# Patient Record
Sex: Female | Born: 1960 | Race: Asian | Hispanic: No | Marital: Married | Smoking: Former smoker
Health system: Southern US, Community
[De-identification: ages and names within clinical notes are randomized; demographics above are authoritative.]

## PROBLEM LIST (undated history)

## (undated) DIAGNOSIS — D131 Benign neoplasm of stomach: Secondary | ICD-10-CM

## (undated) DIAGNOSIS — E039 Hypothyroidism, unspecified: Secondary | ICD-10-CM

## (undated) DIAGNOSIS — K219 Gastro-esophageal reflux disease without esophagitis: Secondary | ICD-10-CM

## (undated) DIAGNOSIS — K449 Diaphragmatic hernia without obstruction or gangrene: Secondary | ICD-10-CM

## (undated) DIAGNOSIS — F419 Anxiety disorder, unspecified: Secondary | ICD-10-CM

## (undated) DIAGNOSIS — D649 Anemia, unspecified: Secondary | ICD-10-CM

## (undated) DIAGNOSIS — N200 Calculus of kidney: Secondary | ICD-10-CM

## (undated) DIAGNOSIS — K760 Fatty (change of) liver, not elsewhere classified: Secondary | ICD-10-CM

## (undated) DIAGNOSIS — L409 Psoriasis, unspecified: Secondary | ICD-10-CM

## (undated) DIAGNOSIS — E119 Type 2 diabetes mellitus without complications: Secondary | ICD-10-CM

## (undated) DIAGNOSIS — E785 Hyperlipidemia, unspecified: Secondary | ICD-10-CM

## (undated) DIAGNOSIS — K222 Esophageal obstruction: Secondary | ICD-10-CM

## (undated) DIAGNOSIS — J45909 Unspecified asthma, uncomplicated: Secondary | ICD-10-CM

## (undated) DIAGNOSIS — F410 Panic disorder [episodic paroxysmal anxiety] without agoraphobia: Secondary | ICD-10-CM

## (undated) DIAGNOSIS — M1990 Unspecified osteoarthritis, unspecified site: Secondary | ICD-10-CM

## (undated) DIAGNOSIS — T7840XA Allergy, unspecified, initial encounter: Secondary | ICD-10-CM

## (undated) DIAGNOSIS — F32A Depression, unspecified: Secondary | ICD-10-CM

## (undated) DIAGNOSIS — G4730 Sleep apnea, unspecified: Secondary | ICD-10-CM

## (undated) HISTORY — DX: Type 2 diabetes mellitus without complications: E11.9

## (undated) HISTORY — DX: Diaphragmatic hernia without obstruction or gangrene: K44.9

## (undated) HISTORY — DX: Sleep apnea, unspecified: G47.30

## (undated) HISTORY — DX: Allergy, unspecified, initial encounter: T78.40XA

## (undated) HISTORY — PX: DILATION AND CURETTAGE OF UTERUS: SHX78

## (undated) HISTORY — PX: CHOLECYSTECTOMY: SHX55

## (undated) HISTORY — DX: Anemia, unspecified: D64.9

## (undated) HISTORY — DX: Anxiety disorder, unspecified: F41.9

## (undated) HISTORY — DX: Depression, unspecified: F32.A

## (undated) HISTORY — DX: Unspecified osteoarthritis, unspecified site: M19.90

## (undated) HISTORY — DX: Hypothyroidism, unspecified: E03.9

## (undated) HISTORY — DX: Benign neoplasm of stomach: D13.1

## (undated) HISTORY — DX: Psoriasis, unspecified: L40.9

## (undated) HISTORY — DX: Unspecified asthma, uncomplicated: J45.909

## (undated) HISTORY — DX: Esophageal obstruction: K22.2

## (undated) HISTORY — DX: Panic disorder (episodic paroxysmal anxiety): F41.0

## (undated) HISTORY — DX: Hyperlipidemia, unspecified: E78.5

## (undated) HISTORY — DX: Fatty (change of) liver, not elsewhere classified: K76.0

## (undated) HISTORY — PX: TONSILLECTOMY: SUR1361

## (undated) HISTORY — DX: Gastro-esophageal reflux disease without esophagitis: K21.9

## (undated) HISTORY — PX: OTHER SURGICAL HISTORY: SHX169

---

## 2001-04-19 ENCOUNTER — Encounter: Admission: RE | Admit: 2001-04-19 | Discharge: 2001-04-19 | Payer: Self-pay | Admitting: Internal Medicine

## 2001-04-19 ENCOUNTER — Encounter: Payer: Self-pay | Admitting: Internal Medicine

## 2002-08-10 ENCOUNTER — Other Ambulatory Visit: Admission: RE | Admit: 2002-08-10 | Discharge: 2002-08-10 | Payer: Self-pay | Admitting: Obstetrics and Gynecology

## 2003-07-24 ENCOUNTER — Other Ambulatory Visit: Admission: RE | Admit: 2003-07-24 | Discharge: 2003-07-24 | Payer: Self-pay | Admitting: Family Medicine

## 2004-01-31 ENCOUNTER — Encounter (INDEPENDENT_AMBULATORY_CARE_PROVIDER_SITE_OTHER): Payer: Self-pay | Admitting: *Deleted

## 2004-01-31 ENCOUNTER — Ambulatory Visit (HOSPITAL_COMMUNITY): Admission: RE | Admit: 2004-01-31 | Discharge: 2004-01-31 | Payer: Self-pay | Admitting: Internal Medicine

## 2004-02-21 ENCOUNTER — Ambulatory Visit (HOSPITAL_COMMUNITY): Admission: RE | Admit: 2004-02-21 | Discharge: 2004-02-21 | Payer: Self-pay | Admitting: Internal Medicine

## 2004-02-21 ENCOUNTER — Encounter: Payer: Self-pay | Admitting: Internal Medicine

## 2004-06-04 ENCOUNTER — Ambulatory Visit: Payer: Self-pay | Admitting: Internal Medicine

## 2004-12-09 ENCOUNTER — Ambulatory Visit: Payer: Self-pay | Admitting: Internal Medicine

## 2004-12-13 ENCOUNTER — Ambulatory Visit: Payer: Self-pay | Admitting: Internal Medicine

## 2004-12-13 ENCOUNTER — Encounter (INDEPENDENT_AMBULATORY_CARE_PROVIDER_SITE_OTHER): Payer: Self-pay | Admitting: Specialist

## 2005-01-13 ENCOUNTER — Other Ambulatory Visit: Admission: RE | Admit: 2005-01-13 | Discharge: 2005-01-13 | Payer: Self-pay | Admitting: Obstetrics and Gynecology

## 2007-07-06 ENCOUNTER — Ambulatory Visit: Payer: Self-pay | Admitting: Internal Medicine

## 2007-07-06 LAB — CONVERTED CEMR LAB: Vitamin B-12: 521 pg/mL (ref 211–911)

## 2007-07-20 ENCOUNTER — Encounter: Payer: Self-pay | Admitting: Internal Medicine

## 2007-07-20 ENCOUNTER — Ambulatory Visit: Payer: Self-pay | Admitting: Internal Medicine

## 2009-06-09 HISTORY — PX: COLONOSCOPY: SHX174

## 2009-06-26 ENCOUNTER — Encounter: Admission: RE | Admit: 2009-06-26 | Discharge: 2009-06-26 | Payer: Self-pay | Admitting: Internal Medicine

## 2009-07-11 ENCOUNTER — Encounter: Admission: RE | Admit: 2009-07-11 | Discharge: 2009-07-11 | Payer: Self-pay | Admitting: Internal Medicine

## 2009-08-27 DIAGNOSIS — K449 Diaphragmatic hernia without obstruction or gangrene: Secondary | ICD-10-CM | POA: Insufficient documentation

## 2009-08-27 DIAGNOSIS — F3289 Other specified depressive episodes: Secondary | ICD-10-CM | POA: Insufficient documentation

## 2009-08-27 DIAGNOSIS — F411 Generalized anxiety disorder: Secondary | ICD-10-CM | POA: Insufficient documentation

## 2009-08-27 DIAGNOSIS — F329 Major depressive disorder, single episode, unspecified: Secondary | ICD-10-CM | POA: Insufficient documentation

## 2009-08-27 DIAGNOSIS — F410 Panic disorder [episodic paroxysmal anxiety] without agoraphobia: Secondary | ICD-10-CM | POA: Insufficient documentation

## 2009-08-27 DIAGNOSIS — K219 Gastro-esophageal reflux disease without esophagitis: Secondary | ICD-10-CM | POA: Insufficient documentation

## 2009-08-27 DIAGNOSIS — K222 Esophageal obstruction: Secondary | ICD-10-CM | POA: Insufficient documentation

## 2009-08-27 DIAGNOSIS — G4730 Sleep apnea, unspecified: Secondary | ICD-10-CM | POA: Insufficient documentation

## 2009-08-31 ENCOUNTER — Ambulatory Visit: Payer: Self-pay | Admitting: Internal Medicine

## 2009-09-25 ENCOUNTER — Encounter (INDEPENDENT_AMBULATORY_CARE_PROVIDER_SITE_OTHER): Payer: Self-pay

## 2009-09-25 ENCOUNTER — Ambulatory Visit: Payer: Self-pay | Admitting: Internal Medicine

## 2009-10-02 ENCOUNTER — Encounter: Admission: RE | Admit: 2009-10-02 | Discharge: 2009-11-06 | Payer: Self-pay | Admitting: Internal Medicine

## 2009-11-23 ENCOUNTER — Ambulatory Visit: Payer: Self-pay | Admitting: Internal Medicine

## 2009-11-26 ENCOUNTER — Encounter: Payer: Self-pay | Admitting: Internal Medicine

## 2010-06-14 ENCOUNTER — Encounter: Admission: RE | Admit: 2010-06-14 | Payer: Self-pay | Source: Home / Self Care | Admitting: Unknown Physician Specialty

## 2010-06-19 ENCOUNTER — Encounter: Admission: RE | Admit: 2010-06-19 | Payer: Self-pay | Source: Home / Self Care | Admitting: Unknown Physician Specialty

## 2010-07-03 ENCOUNTER — Encounter
Admission: RE | Admit: 2010-07-03 | Discharge: 2010-07-03 | Payer: Self-pay | Source: Home / Self Care | Attending: Internal Medicine | Admitting: Internal Medicine

## 2010-07-09 NOTE — Letter (Signed)
Summary: [facility] Instructions  [facility]  [address] [place], [place] [address]   Phone: [phone]  Fax: [phone]       Wendy Curtis    [birth year]    MRN: [id number]       Procedure Day Dorna Bloom: Farrell Ours  11/23/09     Arrival Time:  8:30am     Procedure Time:  9:30a     Location of Procedure:                    _ X_  [facility] (4th Floor)    PREPARATION FOR COLONOSCOPY WITH MIRALAX  Starting 5 days prior to your procedure  Sunday 06/12  do not eat nuts, seeds, popcorn, corn, beans, peas,  salads, or any raw vegetables.  Do not take any fiber supplements (e.g. Metamucil, Citrucel, and Benefiber). ____________________________________________________________________________________________________   THE DAY BEFORE YOUR PROCEDURE         DATE:  06/16 DAY: THURSDAY  1   Drink clear liquids the entire day-NO SOLID FOOD  2   Do not drink anything colored red or purple.  Avoid juices with pulp.  No orange juice.  3   Drink at least 64 oz. (8 glasses) of fluid/clear liquids during the day to prevent dehydration and help the prep work efficiently.  CLEAR LIQUIDS INCLUDE: Water Jello Ice Popsicles Tea (sugar ok, no milk/cream) Powdered fruit flavored drinks Coffee (sugar ok, no milk/cream) Gatorade Juice: apple, white grape, white cranberry  Lemonade Clear bullion, consomm, broth Carbonated beverages (any kind) Strained chicken noodle soup Hard Candy  4   Mix the entire bottle of Miralax with 64 oz. of Gatorade/Powerade in the morning and put in the refrigerator to chill.  5   At 3:00 pm take 2 Dulcolax/Bisacodyl tablets.  6   At 4:30 pm take one Reglan/Metoclopramide tablet.  7  Starting at 5:00 pm drink one 8 oz glass of the Miralax mixture every 15-20 minutes until you have finished drinking the entire 64 oz.  You should finish drinking prep around 7:30 or 8:00 pm.  8   If you are nauseated, you may take the 2nd  Reglan/Metoclopramide tablet at 6:30 pm.        9    At 8:00 pm take 2 more DULCOLAX/Bisacodyl tablets.     THE DAY OF YOUR PROCEDURE      DATE:  06/17  DAY: Farrell Ours  You may drink clear liquids until 7:30am (2 HOURS BEFORE PROCEDURE).   MEDICATION INSTRUCTIONS  Unless otherwise instructed, you should take regular prescription medications with a small sip of water as early as possible the morning of your procedure.         OTHER INSTRUCTIONS  You will need a responsible adult at least 52 years of age to accompany you and drive you home.   This person must remain in the waiting room during your procedure.  Wear loose fitting clothing that is easily removed.  Leave jewelry and other valuables at home.  However, you may wish to bring a book to read or an iPod/MP3 player to listen to music as you wait for your procedure to start.  Remove all body piercing jewelry and leave at home.  Total time from sign-in until discharge is approximately 2-3 hours.  You should go home directly after your procedure and rest.  You can resume normal activities the day after your procedure.  The day of your procedure you should not:  Drive   Make legal decisions   Operate machinery   Drink alcohol   Return to work  You will receive specific instructions about eating, activities and medications before you leave.   The above instructions have been reviewed and explained to me by   Ulis Rias RN  September 25, 2009 4:52 PM     I fully understand and can verbalize these instructions _____________________________ Date _______

## 2010-07-09 NOTE — Procedures (Signed)
Summary: EGD   EGD  Procedure date:  07/20/2007  Findings:      Location: [facility]   Patient Name: Wendy Curtis, Wendy Curtis MRN:  Procedure Procedures: Panendoscopy (EGD) CPT: 43235.    with biopsy(s)/brushing(s). CPT: D1846139.  Personnel: Endoscopist: Dora L. Juanda Chance, MD.  Exam Location: Exam performed in Outpatient Clinic. Outpatient  Patient Consent: Procedure, Alternatives, Risks and Benefits discussed, consent obtained, from patient. Consent was obtained by the RN.  Indications Symptoms: Reflux symptoms for 1-5 yrs,  Surveillance of: Last exam: Jul, 2006.  History  Current Medications: Patient is not currently taking Coumadin.  Pre-Exam Physical: Performed Jul 20, 2007  Entire physical exam was normal.  Comments: Pt. history reviewed/updated, physical exam performed prior to initiation of sedation?yes Exam Exam Info: Maximum depth of insertion Duodenum, intended Duodenum. Vocal cords visualized. Gastric retroflexion performed. Images taken. ASA Classification: I. Tolerance: good.  Sedation Meds: Patient assessed and found to be appropriate for moderate (conscious) sedation. Fentanyl 75 mcg. given IV. Versed 7 mg. given IV. Cetacaine Spray 2 sprays given aerosolized.  Monitoring: BP and pulse monitoring done. Oximetry used. Supplemental O2 given  Findings - STRICTURE / STENOSIS: Stricture in Mid Esophagus.  Constriction: partial. Etiology: benign due to reflux. 30 cm from mouth. ICD9: Esophageal Stricture: 530.3. Comment: nonobstructing esophageal stricture.  - HIATAL HERNIA: Diaphragm 35 cm from mouth. Z-line/GE Junction 30 cm from mouth. Regular, 5 cms. in length. large nonreducible HH. Biopsy/Hiatal Hernia taken.  ICD9: Hernia, Hiatal: 553.3. - Normal: Duodenal Bulb to Duodenal 2nd Portion.  Normal: Antrum.   Assessment Abnormal examination, see findings above.  Diagnoses: 553.3: Hernia, Hiatal.  530.3: Esophageal Stricture.    Comments: large HH unchanged from the prior exam Events  Unplanned Intervention: No unplanned interventions were required.  Unplanned Events: There were no complications. Plans Medication(s): Await pathology. PPI: Omeprazole/Prilosec 20 mg QD, starting Jul 20, 2007   Patient Education: Patient given standard instructions for: Reflux.  Comments: may increase the Prelosec to bid if symptoms flare up Disposition: After procedure patient sent to recovery. After recovery patient sent home.   This report was created from the original endoscopy report, which was reviewed and signed by the above listed endoscopist.

## 2010-07-09 NOTE — Procedures (Signed)
Summary: Upper Endoscopy  Patient: Wendy Curtis Note: All result statuses are Final unless otherwise noted.  Tests: (1) Upper Endoscopy (EGD)   EGD Upper Endoscopy       DONE     [facility]     [address]     [place], [place]  [address]           ENDOSCOPY PROCEDURE REPORT           PATIENT:  Wendy, Curtis  MR#:  [id number]     BIRTHDATE:  03/27/1961, 49 yrs. old  GENDER:  female           ENDOSCOPIST:  Hedwig Morton. Juanda Chance, MD     Referred by:  Ellamae Sia, M.D.           PROCEDURE DATE:  11/23/2009     PROCEDURE:  EGD with biopsy     ASA CLASS:  Class II     INDICATIONS:  GERD, heartburn hx of large hiatal hernia,     F hx of gastric cancer in a brother     last EGD 07/2007     consider Nissen Fundoplication           MEDICATIONS:   Versed 5 mg, Fentanyl 50 mcg     TOPICAL ANESTHETIC:  Exactacain Spray           DESCRIPTION OF PROCEDURE:   After the risks benefits and     alternatives of the procedure were thoroughly explained, informed     consent was obtained.  The [facility] [id number] endoscope was     introduced through the mouth and advanced to the second portion of     the duodenum, without limitations.  The instrument was slowly     withdrawn as the mucosa was fully examined.     <<PROCEDUREIMAGES>>           A stricture was found (see image1). nonobstructing fibrous ring at     g-e junction  A hiatal hernia was found. 5 cm nonreducible hiatal     hernia, 30-35 cm, no Cameron erosions With standard forceps, a     biopsy was obtained and sent to pathology (see image1 and image4).     r/o Barrett's  There were multiple polyps identified. fundic gland     polyps With standard forceps, a biopsy was obtained and sent to     pathology.  Otherwise the examination was normal (see image2,     image3, and image5).    Retroflexed views revealed no     abnormalities.    The scope was then withdrawn from the patient     and the procedure completed.          COMPLICATIONS:  None           ENDOSCOPIC IMPRESSION:     1) Stricture     2) Hiatal hernia     3) Polyps, multiple     4) Otherwise normal examination     RECOMMENDATIONS:     1) Anti-reflux regimen to be follow     2) Await biopsy results     continue Prelosec 20 mg po bid     weight loss,     Zegrid for breakthrough symptoms           REPEAT EXAM:  In 0 year(s) for.           ______________________________     Hedwig Morton. Juanda Chance, MD  CC:           n.     eSIGNED:   Kaylee Wombles M. Deatrice Spanbauer at 11/23/2009 10:17 AM           Charlyn Minerva, [phone]  Note: An exclamation mark (!) indicates a result that was not dispersed into the flowsheet. Document Creation Date: 11/23/2009 10:18 AM _______________________________________________________________________  (1) Order result status: Final Collection or observation date-time: 11/23/2009 09:45 Requested date-time:  Receipt date-time:  Reported date-time:  Referring Physician:   Ordering Physician: Lina Sar [phone]) Specimen Source:  Source: Launa Grill Order Number: [id number] Lab site:

## 2010-07-09 NOTE — Procedures (Signed)
Summary: Colonoscopy  Patient: Marinda Capasso Note: All result statuses are Final unless otherwise noted.  Tests: (1) Colonoscopy (COL)   COL Colonoscopy           [facility]     [address]     [place], [place]  [address]           COLONOSCOPY PROCEDURE REPORT           PATIENT:  Harmony, Sandell  MR#:  [id number]     BIRTHDATE:  [birth year], 49 yrs. old  GENDER:  female     ENDOSCOPIST:  Hedwig Morton. Juanda Chance, MD     REF. BY:  Ellamae Sia, M.D.     PROCEDURE DATE:  11/23/2009     PROCEDURE:  Colonoscopy 13086     ASA CLASS:  Class II     INDICATIONS:  Routine Risk Screening     MEDICATIONS:   Versed 5 mg, Fentanyl 25 mcg           DESCRIPTION OF PROCEDURE:   After the risks benefits and     alternatives of the procedure were thoroughly explained, informed     consent was obtained.  Digital rectal exam was performed and     revealed no rectal masses.   The LB [id number] endoscope     was introduced through the anus and advanced to the cecum, which     was identified by both the appendix and ileocecal valve, without     limitations.  The quality of the prep was excellent, using     MiraLax.  The instrument was then slowly withdrawn as the colon     was fully examined.     <<PROCEDUREIMAGES>>           FINDINGS:  No polyps or cancers were seen (see image1, image2,     image3, image4, image5, and image6).   Retroflexed views in the     rectum revealed no abnormalities.    The scope was then withdrawn     from the patient and the procedure completed.           COMPLICATIONS:  None     ENDOSCOPIC IMPRESSION:     1) No polyps or cancers     2) Normal colonoscopy     RECOMMENDATIONS:     1) high fiber diet     REPEAT EXAM:  In 10 year(s) for.           ______________________________     Hedwig Morton. Juanda Chance, MD           CC:           n.     REVISED:  11/23/2009 10:25 AM     eSIGNED:   Hedwig Morton. Brodie at 11/23/2009 10:25 AM           Charlyn Minerva, [id number]  Note: An exclamation mark (!) indicates a result that was not dispersed into the flowsheet. Document Creation Date: 11/23/2009 10:26 AM _______________________________________________________________________  (1) Order result status: Final Collection or observation date-time: 11/23/2009 10:06 Requested date-time:  Receipt date-time:  Reported date-time:  Referring Physician:   Ordering Physician: Lina Sar [phone]) Specimen Source:  Source: Launa Grill Order Number: [id number] Lab site:   Appended Document: Colonoscopy    Clinical Lists Changes  Observations: Added new observation of COLONNXTDUE: 11/2019 (11/23/2009 14:38)      Appended Document: Colonoscopy 10 yr recall  Procedures Next Due Date:    Colonoscopy: 11/2019

## 2010-07-09 NOTE — Assessment & Plan Note (Signed)
Summary: 6 month f.u consult before ecl..em    History of Present Illness Visit Type: Follow-up Visit Primary GI MD: Lina Sar MD Primary Provider: Ellamae Sia, MD  Requesting Provider: n/a Chief Complaint: Consult for colon and egd. Pt states that she is not having any problems and denies any GI complaints  History of Present Illness:   This is a 50 year old African American female with severe gastroesophageal reflux and a large hiatal hernia documented on several prior upper endoscopies. Her last one was in February 2009. She was at one point evaluated for an upper Nissen fundoplication but decided against it. There is no evidence of Barrett's esophagus but she remains quite anxious about the possibility because her brother had stomach cancer. She comes for followup of gastroesophageal reflux and also to discuss having a colonoscopy. She is currently on Prilosec 20 mg p.o. b.i.d. and Zegrid one at night.   GI Review of Systems      Denies abdominal pain, acid reflux, belching, bloating, chest pain, dysphagia with liquids, dysphagia with solids, heartburn, loss of appetite, nausea, vomiting, vomiting blood, weight loss, and  weight gain.        Denies anal fissure, black tarry stools, change in bowel habit, constipation, diarrhea, diverticulosis, fecal incontinence, heme positive stool, hemorrhoids, irritable bowel syndrome, jaundice, light color stool, liver problems, rectal bleeding, and  rectal pain.    Current Medications (verified): 1)  Prilosec Otc 20 Mg Tbec (Omeprazole Magnesium) .... One Tablet By Mouth Two Times A Day 2)  Aspirin 81 Mg Chew (Aspirin) .... One Tablet By Mouth Once Daily 3)  Vitamin D3 2000 Unit Caps (Cholecalciferol) .... One Tablet By Mouth Once Daily 4)  Zyrtec Allergy 10 Mg Caps (Cetirizine Hcl) .... One Capsule By Mouth Once Daily 5)  Clarinex 5 Mg Tabs (Desloratadine) .... One Tablet By Mouth Once Daily 6)  Flonase 50 Mcg/act Susp (Fluticasone  Propionate) .... As Needed 7)  Zantac 75 75 Mg Tabs (Ranitidine Hcl) .... One Tablet By Mouth Once Daily  Allergies (verified): 1)  ! Pcn 2)  ! * Novacaine  Past History:  Past Medical History: Reviewed history from 08/27/2009 and no changes required. Current Problems:  Hx of ESOPHAGEAL STRICTURE (ICD-530.3) ANXIETY (ICD-300.00) PANIC DISORDER (ICD-300.01) DEPRESSION (ICD-311) SLEEP APNEA (ICD-780.57) HIATAL HERNIA (ICD-553.3) GERD (ICD-530.81)    Past Surgical History: Reviewed history from 08/27/2009 and no changes required. Tonsillectomy & Adenoidectomy D & C Cholecystectomy  Family History: Reviewed history from 08/27/2009 and no changes required. No FH of Colon Cancer: Family History of Diabetes:   Social History: Reviewed history from 08/27/2009 and no changes required. Occupation: Runner, broadcasting/film/video Alcohol Use - yes-occasional Illicit Drug Use - no  Review of Systems  The patient denies allergy/sinus, anemia, anxiety-new, arthritis/joint pain, back pain, blood in urine, breast changes/lumps, change in vision, confusion, cough, coughing up blood, depression-new, fainting, fatigue, fever, headaches-new, hearing problems, heart murmur, heart rhythm changes, itching, menstrual pain, muscle pains/cramps, night sweats, nosebleeds, pregnancy symptoms, shortness of breath, skin rash, sleeping problems, sore throat, swelling of feet/legs, swollen lymph glands, thirst - excessive , urination - excessive , urination changes/pain, urine leakage, vision changes, and voice change.         Pertinent positive and negative review of systems were noted in the above HPI. All other ROS was otherwise negative.   Vital Signs:  Patient profile:   50 year old female Height:      58 inches Weight:      206 pounds BMI:  43.21 BSA:     1.85 Pulse rate:   72 / minute Pulse rhythm:   regular BP sitting:   128 / 82  (left arm) Cuff size:   regular  Vitals Entered By: Ok Anis CMA (August 31, 2009 3:53 PM)   Impression & Recommendations:  Problem # 1:  HIATAL HERNIA (ICD-553.3) There is a 5-6 cm nonreducible hiatal hernia causing chronic gastroesophageal reflux. It seems to be under control with Prilosec 20 mg twice a day and Zegrid, one at bedtime. She will be scheduled for a followup upper endoscopy.  Problem # 2:  SCREENING, COLON CANCER (ICD-V76.51) There is no family history of colon cancer. She has normal bowel habits. We wiill schedule her for a screening colonoscopy.  Patient Instructions: 1)  upper endoscopy with possible dilation and colonoscopy. scheduled for May 2011. 2)  Antireflux measures. 3)  Prilosec 20 mg p.o. b.i.d. 4)  Zegrid 20 mg p.o. for breakthrough symptoms. 5)  Patient wishes to call back to set up endo/colon after she looks at her schedule. 6)  Copy sent to : Dr Merla Riches

## 2010-07-09 NOTE — Letter (Signed)
Summary: Patient [facility] Biopsy Results  [facility]  [address] [place], [place] [address]   Phone: [phone]  Fax: [phone]        November 26, 2009 MRN: [id number]    Wendy Curtis [address] [place], [place]  [address]    Dear Ms. [name],  I am pleased to inform you that the biopsies taken during your recent endoscopic examination did not show any evidence of cancer upon pathologic examination.The biopsies show inflammation due to reflux  Additional information/recommendations:  __No further action is needed at this time.  Please follow-up with      your primary care physician for your other healthcare needs.  __ Please call [phone] to schedule a return visit to review      your condition.  _x_ Continue with the treatment plan as outlined on the day of your      exam.     Please call us if you are having persistent problems or have questions about your condition that have not been fully answered at this time.  Sincerely,  Hart Carwin MD  This letter has been electronically signed by your physician.  Appended Document: Patient Notice-Endo Biopsy Results letter mailed.

## 2010-07-09 NOTE — Miscellaneous (Signed)
Summary: Lec previsit  Clinical Lists Changes  Medications: Added new medication of MIRALAX   POWD (POLYETHYLENE GLYCOL 3350) As per prep  instructions. - Signed Added new medication of REGLAN 10 MG  TABS (METOCLOPRAMIDE HCL) As per prep instructions. - Signed Added new medication of DULCOLAX 5 MG  TBEC (BISACODYL) Day before procedure take 2 at 3pm and 2 at 8pm. - Signed Rx of MIRALAX   POWD (POLYETHYLENE GLYCOL 3350) As per prep  instructions.;  #255gm x 0;  Signed;  Entered by: Ulis Rias RN;  Authorized by: Hart Carwin MD;  Method used: Electronically to [facility]  [phone]*, [address] [place], [place]  [address], Ph: [phone] or [phone], Fax: [phone] Rx of REGLAN 10 MG  TABS (METOCLOPRAMIDE HCL) As per prep instructions.;  #2 x 0;  Signed;  Entered by: Ulis Rias RN;  Authorized by: Hart Carwin MD;  Method used: Electronically to [facility]  [phone]*, [address], [place], [place]  [address], Ph: [phone] or [phone], Fax: [phone] Rx of DULCOLAX 5 MG  TBEC (BISACODYL) Day before procedure take 2 at 3pm and 2 at 8pm.;  #4 x 0;  Signed;  Entered by: Ulis Rias RN;  Authorized by: Hart Carwin MD;  Method used: Electronically to [facility]  [phone]*, [address], [place], [place]  [address], Ph: [phone] or [phone], Fax: [phone] Observations: Added new observation of ALLERGY REV: Done (09/25/2009 16:14)    Prescriptions: DULCOLAX 5 MG  TBEC (BISACODYL) Day before procedure take 2 at 3pm and 2 at 8pm.  #4 x 0   Entered by:   Ulis Rias RN   Authorized by:   Hart Carwin MD   Signed by:   Ulis Rias RN on 09/25/2009   Method used:   Electronically to        [facility]  [phone]* (retail)       [address] [place], [place]  [address]       Ph: [phone] or [phone]       Fax: [phone]   RxID:   [id number] REGLAN 10 MG  TABS (METOCLOPRAMIDE HCL) As per prep instructions.  #2 x 0  Entered by:   Ulis Rias RN   Authorized by:   Hart Carwin MD   Signed by:   Ulis Rias RN on 09/25/2009   Method used:   Electronically to        [facility]  [phone]* (retail)       [address] [place], [place]  [address]       Ph: [phone] or [phone]       Fax: [phone]   RxID:   [id number] MIRALAX   POWD (POLYETHYLENE GLYCOL 3350) As per prep  instructions.  #255gm x 0   Entered by:   Ulis Rias RN   Authorized by:   Hart Carwin MD   Signed by:   Ulis Rias RN on 09/25/2009   Method used:   Electronically to        [facility]  [phone]* (retail)       [address] [place], [place]  [address]       Ph: [phone] or [phone]       Fax: [phone]   RxID:   [id number]

## 2010-07-10 ENCOUNTER — Encounter: Payer: Self-pay | Admitting: Unknown Physician Specialty

## 2010-10-22 NOTE — Assessment & Plan Note (Signed)
[facility]                         GASTROENTEROLOGY OFFICE NOTE   JAYCIE, KREGEL                      MRN:          [id number]  DATE:07/06/2007                            DOB:          April 04, 1961    [name] is a 50 year old [place] female. old [place] female.  She is a Runner, broadcasting/film/video at  [employer] who has chronic esophageal reflux disease.  We have evaluated her in the past for her reflux and even scheduled her  for monometry and pH probe, but she decided to continue with medical  treatment and has done well for the past several years.  She is now  again considering possibility of Nissen fundoplication.  She has a large  hiatal hernia shown on upper GI series in 2005 measuring about 6-8 cm.  This also was confirmed on upper endoscopy, first one done in August  2005, the last one in July 2006.  Her diaphragm is at 35 cm, Z line at  30 cm.  It is an nonreducible hernia.  It was very well controlled with  strict antireflux measures and Aciphex 200 mg twice a day until her  insurance suggested using Prilosec.  She has taken Prilosec 20 mg twice  a day since October 2008 and has about 90% relief of her symptoms.  She  on occasion has a scratchiness and burning in the back of her throat at  night.  She, unfortunately, has been unable to lose weight.  In fact,  she has continued to gain weight from 177 pounds in October 2005 to 184  pounds in July 2008, then 194 pounds now.  She comes today to discuss  treatment with surgery.  She also wants to know the long-term effect of  proton pump inhibitors and choice of proton pump inhibitor.   PHYSICAL EXAMINATION:  VITAL SIGNS:  Blood pressure 98/64, pulse 80,  weight 194 pounds.  The patient was not re-examined today.   IMPRESSION:  A 50 year old African-American female female who has a large  hiatal hernia, chronic esophageal reflux reasonably well controlled on  proton pump inhibitor twice-a-day dose.  There  was no evidence of  Barrett's esophagus on most recent endoscopy in July 2006.  The patient  is interested in being reevaluated and assessed for possible Nissen  fundoplication.  She is concerned about possible B12 deficiency, the  long-term use of proton pump inhibitor, the expense of it as well as  possibly increased GI infections.   PLAN:  1. Upper endoscopy scheduled with appropriate biopsies.  We will also      re-examine the hiatal hernia for size and general anatomy of her      stomach.  2. She will certainly need another barium swallow and possibly      manometry before a final decision will be made for Nissen      fundoplication.  3. She would like to speak with Dr. Luretha Murphy concerning the      surgery which probably would be planned for the summer of 2009 when      she is out of school.  4. The patient may  use Tums or Rolaids at bedtime for additional      coating effect and antacid effect.  5. Will check her B12 level today.  6. Continue Prilosec 20 mg twice a day.     Hedwig Morton. Juanda Chance, MD  Electronically Signed    DMB/MedQ  DD: 07/06/2007  DT: 07/07/2007  Job #: [id number]   cc:   Harrel Lemon. Merla Riches, M.D.  [place] Daphine Deutscher, MD

## 2011-02-11 ENCOUNTER — Ambulatory Visit: Payer: Self-pay | Admitting: Internal Medicine

## 2011-02-12 ENCOUNTER — Ambulatory Visit: Payer: Self-pay | Admitting: Internal Medicine

## 2011-05-20 ENCOUNTER — Encounter: Payer: Self-pay | Admitting: *Deleted

## 2011-05-27 ENCOUNTER — Ambulatory Visit: Payer: Self-pay | Admitting: Internal Medicine

## 2011-06-11 ENCOUNTER — Encounter: Payer: Self-pay | Admitting: Internal Medicine

## 2011-06-20 ENCOUNTER — Encounter: Payer: Self-pay | Admitting: *Deleted

## 2011-07-02 ENCOUNTER — Ambulatory Visit: Payer: Self-pay | Admitting: Internal Medicine

## 2011-07-23 ENCOUNTER — Encounter: Payer: Self-pay | Admitting: Internal Medicine

## 2011-09-12 ENCOUNTER — Ambulatory Visit (INDEPENDENT_AMBULATORY_CARE_PROVIDER_SITE_OTHER): Payer: BC Managed Care – PPO | Admitting: Internal Medicine

## 2011-09-12 ENCOUNTER — Ambulatory Visit: Payer: BC Managed Care – PPO

## 2011-09-12 DIAGNOSIS — IMO0001 Reserved for inherently not codable concepts without codable children: Secondary | ICD-10-CM | POA: Insufficient documentation

## 2011-09-12 DIAGNOSIS — J309 Allergic rhinitis, unspecified: Secondary | ICD-10-CM | POA: Insufficient documentation

## 2011-09-12 DIAGNOSIS — Z6841 Body Mass Index (BMI) 40.0 and over, adult: Secondary | ICD-10-CM

## 2011-09-12 DIAGNOSIS — L508 Other urticaria: Secondary | ICD-10-CM | POA: Insufficient documentation

## 2011-09-12 DIAGNOSIS — M546 Pain in thoracic spine: Secondary | ICD-10-CM

## 2011-09-12 DIAGNOSIS — N200 Calculus of kidney: Secondary | ICD-10-CM | POA: Insufficient documentation

## 2011-09-12 DIAGNOSIS — R739 Hyperglycemia, unspecified: Secondary | ICD-10-CM

## 2011-09-12 DIAGNOSIS — E789 Disorder of lipoprotein metabolism, unspecified: Secondary | ICD-10-CM

## 2011-09-12 DIAGNOSIS — R1084 Generalized abdominal pain: Secondary | ICD-10-CM

## 2011-09-12 DIAGNOSIS — J45909 Unspecified asthma, uncomplicated: Secondary | ICD-10-CM | POA: Insufficient documentation

## 2011-09-12 LAB — POCT CBC
Lymph, poc: 3.7 — AB (ref 0.6–3.4)
MCH, POC: 28 pg (ref 27–31.2)
MCHC: 32.9 g/dL (ref 31.8–35.4)
MCV: 84.9 fL (ref 80–97)
MID (cbc): 0.6 (ref 0–0.9)
MPV: 9.2 fL (ref 0–99.8)
POC LYMPH PERCENT: 36.9 %L (ref 10–50)
POC MID %: 5.5 %M (ref 0–12)
Platelet Count, POC: 322 10*3/uL (ref 142–424)
RDW, POC: 15.5 %
WBC: 10.1 10*3/uL (ref 4.6–10.2)

## 2011-09-12 LAB — COMPREHENSIVE METABOLIC PANEL
ALT: 22 U/L (ref 0–35)
AST: 17 U/L (ref 0–37)
Albumin: 4.2 g/dL (ref 3.5–5.2)
Alkaline Phosphatase: 82 U/L (ref 39–117)
Calcium: 8.8 mg/dL (ref 8.4–10.5)
Chloride: 101 mEq/L (ref 96–112)
Potassium: 4.4 mEq/L (ref 3.5–5.3)
Sodium: 136 mEq/L (ref 135–145)
Total Protein: 7.1 g/dL (ref 6.0–8.3)

## 2011-09-12 NOTE — Progress Notes (Signed)
Subjective:    Patient ID: Wendy Curtis, female    DOB: 1961/01/01, 51 y.o.   MRN: [id number]  HPIReturns for followup of gastrointestinal problems When she was here in December we tried to establish referral for a barium swallow for her dysphagia and follow up with Dr. Dickie La for both upper and lower problems. This is been postponed and her followup is in early May. She continues to have atypical abdominal pain that is in both upper quadrants and sometimes seems to radiate around to the back. This comes and goes without relationship to eating or activity and is not associated with constipation or diarrhea. She also has pain in the midthoracic area with certain movements that is sharp. This occasionally interferes with sleep. There is no history of any injury. X-ray of this area 2 years ago revealed mild degenerative changes.Appetite remains good in fact she overeats frequently as she describes herself as a comfort eater Her dysphagia resolved or greatly improved. She still has the hiatal hernia and esophageal stricture which cause her some lower esophageal problems, with reflux being secondary. The persistence of these vague other symptoms of pain are worrisome to her and she thinks she might have cancer of the pancreas.    Review of Systems  HENT:       She continues with allergic rhinitis but this is not currently a problem  Respiratory:       No recent problems with asthma  Cardiovascular: Negative for chest pain, palpitations and leg swelling.  Genitourinary: Negative.   Musculoskeletal: Negative.   Skin:       No recent problems with her chronic urticaria   She has not had weight loss night sweats or fevers    Objective:   Physical Exam In no acute distress Obese HEENT is clear with no thyromegaly Heart is regular without murmurs rubs or gallops Lungs are clear She is tender along the thoracic spine at T4-5 and 6/this includes the paraspinous muscles and is exacerbated by  twisting and reaching Her abdomen is nondistended/she is mildly tender in both upper quadrants without masses or organomegaly/lower quadrants are clear       Results for orders placed in visit on 09/12/11  POCT CBC      Component Value Range   WBC 10.1  4.6 - 10.2 (K/uL)   Lymph, poc 3.7 (*) 0.6 - 3.4    POC LYMPH PERCENT 36.9  10 - 50 (%L)   MID (cbc) 0.6  0 - 0.9    POC MID % 5.5  0 - 12 (%M)   POC Granulocyte 5.8  2 - 6.9    Granulocyte percent 57.6  37 - 80 (%G)   RBC 4.97  4.04 - 5.48 (M/uL)   Hemoglobin 13.9  12.2 - 16.2 (g/dL)   HCT, POC 40.9  81.1 - 47.9 (%)   MCV 84.9  80 - 97 (fL)   MCH, POC 28.0  27 - 31.2 (pg)   MCHC 32.9  31.8 - 35.4 (g/dL)   RDW, POC 91.4     Platelet Count, POC 322  142 - 424 (K/uL)   MPV 9.2  0 - 99.8 (fL)  POCT GLYCOSYLATED HEMOGLOBIN (HGB A1C)      Component Value Range   Hemoglobin A1C 6.8    POCT SEDIMENTATION RATE      Component Value Range   POCT SED RATE    0 - 22 (mm/hr)  esr 45 [facility] reading (PRIMARY) by  Dr. Bernedette Auston=thoracic spine has  mild scoliosis with mild degenerative changes/the abdomen is benign and suggests constipation   Assessment & Plan:  Problem #1 prolonged abdominal pain Problem #2 GERD Problem #3 intermittent dysphagia/large hiatal hernia Evaluation by Dr. Dickie La with endoscopy is pending for May 1 CT with of the abdomen and pelvis with contrast is indicated because of her age and her concern that she might have pancreatic disease/she is also concerned because her brother died of stomach cancer at age 88  Problem #4 intermittent thoracic pain-this seems musculoskeletal in nature/for now we will use over-the-counter medications and stretching  Problem #5 hyperglycemia/hemoglobin A1c is 6.8% She is given an ADA diet/and an alternative diet- Schwazein and asked to target 1 pounds every week or 10 days/she is a stress eater and some alternatives were discussed  Problem #6 BMI over 40 Problem #7 sedimentation rate 45  without clear etiology

## 2011-09-13 ENCOUNTER — Encounter: Payer: Self-pay | Admitting: Internal Medicine

## 2011-09-13 DIAGNOSIS — D568 Other thalassemias: Secondary | ICD-10-CM | POA: Insufficient documentation

## 2011-10-08 ENCOUNTER — Ambulatory Visit (INDEPENDENT_AMBULATORY_CARE_PROVIDER_SITE_OTHER): Payer: BC Managed Care – PPO | Admitting: Internal Medicine

## 2011-10-08 ENCOUNTER — Encounter: Payer: Self-pay | Admitting: Internal Medicine

## 2011-10-08 ENCOUNTER — Other Ambulatory Visit: Payer: Self-pay | Admitting: Internal Medicine

## 2011-10-08 VITALS — BP 106/71 | HR 73 | Temp 98.7°F | Resp 16 | Ht <= 58 in | Wt 205.6 lb

## 2011-10-08 DIAGNOSIS — Z1231 Encounter for screening mammogram for malignant neoplasm of breast: Secondary | ICD-10-CM

## 2011-10-08 DIAGNOSIS — R739 Hyperglycemia, unspecified: Secondary | ICD-10-CM

## 2011-10-08 DIAGNOSIS — Z6841 Body Mass Index (BMI) 40.0 and over, adult: Secondary | ICD-10-CM

## 2011-10-08 DIAGNOSIS — K222 Esophageal obstruction: Secondary | ICD-10-CM

## 2011-10-08 DIAGNOSIS — Z0000 Encounter for general adult medical examination without abnormal findings: Secondary | ICD-10-CM

## 2011-10-08 DIAGNOSIS — K449 Diaphragmatic hernia without obstruction or gangrene: Secondary | ICD-10-CM

## 2011-10-08 LAB — LIPID PANEL
HDL: 39 mg/dL — ABNORMAL LOW (ref >39)
LDL Cholesterol: 135 mg/dL — ABNORMAL HIGH (ref 0–99)
Total CHOL/HDL Ratio: 5.3 Ratio
VLDL: 32 mg/dL (ref 0–40)

## 2011-10-08 LAB — POCT CBG (FASTING - GLUCOSE)-MANUAL ENTRY: Glucose Fasting, POC: 124 mg/dL — AB (ref 70–99)

## 2011-10-08 MED ORDER — FLUTICASONE PROPIONATE 50 MCG/ACT NA SUSP: 2.0000 | NASAL | Status: DC | PRN

## 2011-10-08 MED ORDER — CETIRIZINE HCL 10 MG PO TABS
10.0000 mg | ORAL_TABLET | Freq: Every day | ORAL | Status: AC
Start: 2011-10-08 — End: ?

## 2011-10-08 MED ORDER — MONTELUKAST SODIUM 10 MG PO TABS: 10.0000 mg | ORAL_TABLET | Freq: Every day | ORAL | Status: DC

## 2011-10-08 MED ORDER — RANITIDINE HCL 75 MG PO TABS: 75.0000 mg | ORAL_TABLET | Freq: Every day | ORAL | Status: DC

## 2011-10-08 MED ORDER — ALPRAZOLAM 0.25 MG PO TABS: 0.2500 mg | ORAL_TABLET | Freq: Three times a day (TID) | ORAL | Status: AC | PRN

## 2011-10-08 MED ORDER — OMEPRAZOLE 20 MG PO CPDR: 20.0000 mg | DELAYED_RELEASE_CAPSULE | Freq: Two times a day (BID) | ORAL | Status: DC

## 2011-10-08 NOTE — Progress Notes (Signed)
Subjective:    Patient ID: Wendy Curtis, female    DOB: 10/01/1960, 51 y.o.   MRN: [id number]  HPIannual exam Patient Active Problem List  Diagnoses  . ANXIETY  . PANIC DISORDER  . DEPRESSION  . ESOPHAGEAL STRICTURE  . GERD  . HIATAL HERNIA  . SLEEP APNEA  . BMI 40.0-44.9, adult  . Hyperglycemia  . Allergic rhinitis  . Asthma  . Chronic urticaria  . Nephrolithiasis  . 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 No etiology was obvious and the cause of the long history and intermittent nature and her degree of concern a CT scan was ordered to visualize the pancreas and hopes of proving that there was no pancreatic cancer which she was most concerned about. The cause of her anxiety this was a very important question for her. Insurance denied the CT scan. At this point her amylase is normal, her liver functions are normal, and her abdominal pain has improved with the treatment for constipation and we started at the last visit. She has an appointment with Dr. Juanda Chance next week for further GI evaluation with particular focus on her esophageal symptoms.  Of concern over the past few months has been the finding of hyperglycemia and she is to have hemoglobin A1c testing today. It was 6.8% one month ago and she has made a series of 10 a changing her diet with resulting 6 pounds of weight loss.    Review of Systems  Constitutional: Negative for fever, activity change, fatigue and unexpected weight change.  HENT: Positive for rhinorrhea and trouble swallowing. Negative for ear pain, neck pain, neck stiffness, voice change and tinnitus.        Her allergic rhinitis symptoms have been mild to moderate and she is helped by Zyrtec,fluticasone, and Singulair.  Eyes: Negative for visual disturbance.   Respiratory: Negative for cough, choking, chest tightness, shortness of breath and wheezing.   Cardiovascular: Negative for palpitations and leg swelling.       Has occasional left posterior chest wall pain when she Spent time lying in the bed watching TV. This is not exacerbated by deep breathing or coughing and rarely occurs during the daytime. Unclear if this is associated with her left upper quadrant symptoms. Her asthma has been controlled recently  Gastrointestinal: Positive for constipation. Negative for nausea, vomiting, diarrhea, blood in stool and rectal pain.  Genitourinary: Negative for dysuria, frequency, difficulty urinating and pelvic pain.       Last menstrual period was 5-6 months ago and she has not had hot flashes or night sweats  Skin:       She has a lesion on the left buttock which is without symptoms but is to be rechecked today  Neurological: Negative for dizziness, speech difficulty, light-headedness and headaches.  Hematological: Negative for adenopathy. Does not bruise/bleed easily.  Psychiatric/Behavioral: Negative for confusion and dysphoric mood.       Objective:   Physical Exam  Constitutional: She is oriented to person, place, and time. No distress.       BMI over 40  HENT:  Head: Normocephalic.  Right Ear: External ear normal.  Left Ear: External ear normal.  Nose: Nose normal.  Mouth/Throat: Oropharynx is clear and moist.  Eyes: Conjunctivae and EOM are normal. Pupils are  equal, round, and reactive to light.  Neck: Normal range of motion. Neck supple. No thyromegaly present.  Cardiovascular: Normal rate, regular rhythm, normal heart sounds and intact distal pulses.   No murmur heard. Pulmonary/Chest: Effort normal and breath sounds normal. She has no wheezes.       There is no tenderness to palpation of the left flank or left posterior chest wall  Abdominal: Soft. Bowel sounds are normal. She exhibits no distension and no mass. There is no  tenderness. There is no rebound and no guarding.       No tenderness specifically in the left upper quadrant  Musculoskeletal: Normal range of motion. She exhibits no edema and no tenderness.  Lymphadenopathy:    She has no cervical adenopathy.  Neurological: She is alert and oriented to person, place, and time. She has normal reflexes. No cranial nerve deficit.  Skin: No erythema.       She has a 1 cm circular hyperpigmented firm nodule in the left gluteal area  Psychiatric: She has a normal mood and affect. Her behavior is normal. Judgment and thought content normal.          Amylase    Component Value Date/Time   AMYLASE 52 09/12/2011 1543   Results for orders placed in visit on 10/08/11  POCT GLYCOSYLATED HEMOGLOBIN (HGB A1C)      Component Value Range   Hemoglobin A1C 6.2    POCT CBG (FASTING - GLUCOSE)-MANUAL ENTRY      Component Value Range   Glucose Fasting, POC 124 (*) 70 - 99 (mg/dL)     Assessment & Plan:  Problem #1 obesity Problem #2 hyperglycemia This A1c is very good being only 1 month after the last one/her commitment to dietary change and weight loss as well we need to focus on//  Problem #3 depression and anxiety with occasional panic symptoms-stable for now on no medication She has a prescription for when necessary Xanax 0.25 mg which she almost never uses.  Problem #4 hiatal hernia, esophageal stricture, GERD, dysphagia occasionally, and left upper quadrant pain.-She is scheduled for endoscopy and further GI consideration with Dr. Juanda Chance next week In further questioning about a possible GI malignancy will be considered at that point. With no weight loss or night sweats and no excessive fatigue, have been reassuring her about her chances for malignancy.  Problem #5 allergic rhinitis/reactive asthma/chronic urticaria-these problems are also stable at this point  Problem #6 annual exam-her health maintenance items are up to date and I will mail her copies of  today's lab evaluation  Problem #7 dermatofibroma left buttock-continue observation  Meds ordered this encounter  Medications  . DISCONTD: ALPRAZolam (XANAX PO)    Sig: Take by mouth.  . DISCONTD: montelukast (SINGULAIR) 10 MG tablet    Sig:   . PRILOSEC OTC 20 MG tablet    Sig:   . omeprazole (PRILOSEC) 20 MG capsule    Sig: Take 1 capsule (20 mg total) by mouth 2 (two) times daily.    Dispense:  60 capsule    Refill:  11  . ranitidine (ZANTAC) 75 MG tablet    Sig: Take 1 tablet (75 mg total) by mouth daily.    Dispense:  30 tablet    Refill:  11  . montelukast (SINGULAIR) 10 MG tablet    Sig: Take 1 tablet (10 mg total) by mouth at bedtime.    Dispense:  30 tablet    Refill:  11  . fluticasone (FLONASE)  50 MCG/ACT nasal spray    Sig: Place 2 sprays into the nose as needed.    Dispense:  16 g    Refill:  11  . cetirizine (ZYRTEC) 10 MG tablet    Sig: Take 1 tablet (10 mg total) by mouth daily.    Dispense:  90 tablet    Refill:  3  . ALPRAZolam (XANAX) 0.25 MG tablet    Sig: Take 1 tablet (0.25 mg total) by mouth 3 (three) times daily as needed.    Dispense:  30 tablet    Refill:  2   Recheck 3-6 months depending on symptoms

## 2011-10-10 ENCOUNTER — Encounter: Payer: Self-pay | Admitting: Internal Medicine

## 2011-10-10 LAB — T4, FREE: Free T4: 1.01 ng/dL (ref 0.80–1.80)

## 2011-10-13 ENCOUNTER — Ambulatory Visit
Admission: RE | Admit: 2011-10-13 | Discharge: 2011-10-13 | Disposition: A | Discharge status: Home / Self Care | Payer: BC Managed Care – PPO | Source: Ambulatory Visit | Attending: Internal Medicine | Admitting: Internal Medicine

## 2011-10-13 ENCOUNTER — Encounter: Payer: Self-pay | Admitting: Internal Medicine

## 2011-10-13 ENCOUNTER — Ambulatory Visit (INDEPENDENT_AMBULATORY_CARE_PROVIDER_SITE_OTHER): Payer: Self-pay | Admitting: Internal Medicine

## 2011-10-13 VITALS — BP 94/64 | HR 88 | Ht <= 58 in | Wt 207.5 lb

## 2011-10-13 DIAGNOSIS — Z1231 Encounter for screening mammogram for malignant neoplasm of breast: Secondary | ICD-10-CM

## 2011-10-13 DIAGNOSIS — R1012 Left upper quadrant pain: Secondary | ICD-10-CM

## 2011-10-13 MED ORDER — ALIGN 4 MG PO CAPS: 1.0000 | ORAL_CAPSULE | Freq: Every day | ORAL | Status: DC

## 2011-10-13 NOTE — Progress Notes (Signed)
Wendy Curtis [birth year] MRN [id number]   History of Present Illness:  This is a 51 year old [place] female with a history of severe gastroesophageal reflux and a large hiatal hernia currently controlled on a combination of Prilosec 20 mg twice a day and Zantac 75 mg when necessary. She is having  intermittent abdominal pain localized to the left upper quadrant. It is sharp and crampy, sometimes radiating to the back. She has continued to gain weight. She also has had a change in the bowel habits toward constipation. She denies rectal bleeding. She is up-to-date on her colonoscopy which was done in June 2011 and was a normal exam. Her last upper endoscopy in June 2011 and prior to that in February 2009 showed a 5 cm hiatal hernia and reflux esophagitis but no evidence for Barrett's esophagus. Her brother had gastric cancer and patient has been quite anxious about staying up-to-date on her exams. She was at one time evaluated for Nissen fundoplication but decided against it. An pper abdominal ultrasound in 2005 showed her to be status post cholecystectomy with a 6.1 mm common bile duct. CT scan of the abdomen at that time confirmed the presence of a hiatal hernia.   Past Medical History  Diagnosis Date  . Esophageal stricture   . Anxiety   . Panic disorder   . Sleep apnea   . Hiatal hernia   . GERD (gastroesophageal reflux disease)   . Allergic rhinitis    Past Surgical History  Procedure Date  . Tonsillectomy   . Dilation and curettage of uterus   . Cholecystectomy     reports that she has quit smoking. She has never used smokeless tobacco. She reports that she drinks alcohol. She reports that she does not use illicit drugs. family history includes Arthritis in an unspecified family member; Diabetes in her brother, mother, and sister; and Stomach cancer in her brother.  There is no history of Colon cancer. Allergies  Allergen Reactions  . Celebrex (Celecoxib)     Severe GI upset  (pain)  . Meloxicam     Tongue sweeling  . Penicillins     REACTION: rash  . Procaine Hcl Other (See Comments)    Jittery, sweating        Review of Systems: Denies acid reflux. Dysphagia odynophagia shortness of breath  The remainder of the 10 point ROS is negative except as outlined in H&P   Physical Exam: General appearance  Well developed, in no distress. Overweight Eyes- non icteric. HEENT nontraumatic, normocephalic. Mouth no lesions, tongue papillated, no cheilosis. Neck supple without adenopathy, thyroid not enlarged, no carotid bruits, no JVD. Lungs Clear to auscultation bilaterally. Cor normal S1, normal S2, regular rhythm, no murmur,  quiet precordium. Abdomen: Obese but soft with no palpable mass. Minimal tenderness across upper abdomen. Left lower quadrant normal. Liver edge at costal margin. No CVA tenderness. Rectal: Not done. Extremities no pedal edema. Skin no lesions. Neurological alert and oriented x 3. Psychological normal mood and affect.  Assessment and plan  Problem #1 Episodic sharp upper abdominal discomfort and back discomfort which may be unrelated to the abdominal wall pain. Her symptoms are nonspecific and more consistent with an irritable bowel syndrome or splenic flexure syndrome. We will put her on a probiotic one a day and as an alternative she may take Psyllium fiber 1 tablespoon daily to improve her bowel habits. She is not due for colonoscopy until 2021. She never had Barrett's esophagus and therefore she is up-to-date on  her upper endoscopy. We will proceed with an ultrasound of the left upper quadrant; specifically of the pancreas and spleen. She is going to continue on her antireflux regimen and try to lose weight.  10/13/2011 Lina Sar

## 2011-10-13 NOTE — Patient Instructions (Signed)
Please discontinue Zantac. We have given you samples of Align. This puts good bacteria back into your colon. You should take 1 capsule by mouth once daily. If this works well for you, it can be purchased over the counter. You have been scheduled for an abdominal ultrasound at [facility] (1st floor of hospital) on Friday, 10/17/11 at 1:00 pm. Please arrive 15 minutes prior to your appointment for registration. Make certain not to have anything to eat or drink 6 hours prior to your appointment. Should you need to reschedule your appointment, please contact radiology at [phone]. CC: Dr Ellamae Sia

## 2011-10-15 ENCOUNTER — Ambulatory Visit: Payer: Self-pay | Admitting: Internal Medicine

## 2011-10-17 ENCOUNTER — Ambulatory Visit (HOSPITAL_COMMUNITY)
Admission: RE | Admit: 2011-10-17 | Discharge: 2011-10-17 | Disposition: A | Discharge status: Home / Self Care | Payer: BC Managed Care – PPO | Source: Ambulatory Visit | Attending: Internal Medicine | Admitting: Internal Medicine

## 2011-10-17 DIAGNOSIS — Z9089 Acquired absence of other organs: Secondary | ICD-10-CM | POA: Insufficient documentation

## 2011-10-17 DIAGNOSIS — R1012 Left upper quadrant pain: Secondary | ICD-10-CM

## 2011-10-21 ENCOUNTER — Telehealth: Payer: Self-pay

## 2011-10-21 NOTE — Telephone Encounter (Signed)
Pt would like to talk to a nurse regarding her allergies and to dr Merla Riches about her test results

## 2011-10-22 NOTE — Telephone Encounter (Signed)
LMOM to CB. Get details/questions

## 2011-10-24 NOTE — Telephone Encounter (Signed)
LMOM TO CB 

## 2011-10-27 NOTE — Telephone Encounter (Signed)
LMOM to CB if she still has ?s/concerns and mailed unable to reach letter.

## 2011-12-08 ENCOUNTER — Other Ambulatory Visit: Payer: Self-pay | Admitting: Physician Assistant

## 2011-12-23 ENCOUNTER — Other Ambulatory Visit: Payer: Self-pay | Admitting: Physician Assistant

## 2012-01-01 ENCOUNTER — Other Ambulatory Visit: Payer: Self-pay | Admitting: Internal Medicine

## 2012-01-02 NOTE — Telephone Encounter (Signed)
Please advise pharmacy that this request should go to the patient's GI physician (Dr. Lina Sar).

## 2012-01-06 ENCOUNTER — Telehealth: Payer: Self-pay

## 2012-01-06 NOTE — Telephone Encounter (Signed)
Ok

## 2012-01-06 NOTE — Telephone Encounter (Signed)
[facility] STATES THEY HAVE BEEN REQUESTING A REFILL ON PT'S PRILOSEC, SHE WOULD LIKE A 3 MONTH SUPPLY AND THE PRILOSEC OTC PLEASE CALL [phone]      [facility] AT [phone]

## 2012-01-07 MED ORDER — PRILOSEC OTC 20 MG PO TBEC: 20.0000 mg | DELAYED_RELEASE_TABLET | Freq: Two times a day (BID) | ORAL | Status: DC

## 2012-01-07 NOTE — Telephone Encounter (Signed)
Patient sees Lina Sar, MD for her GI problems.  The refills should come from her for this.

## 2012-01-07 NOTE — Telephone Encounter (Signed)
Pt Wendy Curtis and stated that Dr Merla Riches always Rxs all of her meds, even the GI Rxs, and he said that he sent it over electronically at the 10/08/11 OV. Checked EPIC and it had been sent w/1 yr of RFs. Pt did state that she takes the name brand, not generic, and would like it in a 90 day supply. Called pharmacist since [facility] was [facility] and gave him the corrected Rx for the name brand and changed to 90 day. Changed in EPIC for records.   Dr Merla Riches, pt requests that you send in a Rx for her Zegerid that she takes as needed. She didn't need it at the time of the OV, but does use it prn. [facility] [address] Pt's chart is in your box [phone].

## 2012-01-07 NOTE — Telephone Encounter (Signed)
Left message for call back.

## 2012-02-25 ENCOUNTER — Telehealth: Payer: Self-pay

## 2012-02-25 NOTE — Telephone Encounter (Signed)
Pt called stating she thinks she is developing bronchitis and would like one of Dr. Netta Corrigan nurses to give her a call.

## 2012-02-26 NOTE — Telephone Encounter (Signed)
Lm for her to call me back, she needs to come in.

## 2012-03-01 NOTE — Telephone Encounter (Signed)
Lm for her again to call me back.

## 2012-03-15 ENCOUNTER — Ambulatory Visit (INDEPENDENT_AMBULATORY_CARE_PROVIDER_SITE_OTHER): Payer: BC Managed Care – PPO | Admitting: Internal Medicine

## 2012-03-15 VITALS — BP 112/70 | HR 76 | Temp 98.2°F | Resp 16 | Ht 58.58 in | Wt 211.2 lb

## 2012-03-15 DIAGNOSIS — J329 Chronic sinusitis, unspecified: Secondary | ICD-10-CM

## 2012-03-15 DIAGNOSIS — J9801 Acute bronchospasm: Secondary | ICD-10-CM

## 2012-03-15 DIAGNOSIS — J45909 Unspecified asthma, uncomplicated: Secondary | ICD-10-CM

## 2012-03-15 DIAGNOSIS — J42 Unspecified chronic bronchitis: Secondary | ICD-10-CM

## 2012-03-15 MED ORDER — PREDNISONE 20 MG PO TABS: ORAL_TABLET | ORAL | Status: DC

## 2012-03-15 MED ORDER — AZITHROMYCIN 250 MG PO TABS: ORAL_TABLET | ORAL | Status: DC

## 2012-03-16 NOTE — Progress Notes (Signed)
  Subjective:    Patient ID: Wendy Curtis, female    DOB: 1960-08-14, 51 y.o.   MRN: [id number]  HPIComplaining of a three-week history of congestion with purulent discharge in coughing paroxysms Thinks she is wheezing at times especially at night Cough keeps husband awake No fever Has long history of allergic rhinitis and allergic bronchospasm Has history of GERD but recently stable  Albuterol relieves wheezing easily but causes coughing paroxysm Singulair/Zyrtec taken daily/Also fluticasone Review of Systems Noncontributory    Objective:   Physical Exam Vital signs stable except overweight Conjunctiva clear TMs clear Nares boggy with purulent mucus Throat clear No anterior cervical nodes Lungs with slight wheezing on forced expiration otherwise clear       Assessment & Plan:  Problem #1 sinusitis Problem #2 reactive airway disease Meds ordered this encounter  Medications  . azithromycin (ZITHROMAX) 250 MG tablet    Sig: As packaged    Dispense:  6 tablet    Refill:  0  . predniSONE (DELTASONE) 20 MG tablet    Sig: 3/3/2/2/1/1 single daily dose for 6 days    Dispense:  12 tablet    Refill:  0  Continue other medications Add Mucinex Followup one week if not well

## 2012-09-19 ENCOUNTER — Other Ambulatory Visit: Payer: Self-pay | Admitting: Physician Assistant

## 2012-10-14 ENCOUNTER — Other Ambulatory Visit: Payer: Self-pay

## 2012-10-14 DIAGNOSIS — Z1231 Encounter for screening mammogram for malignant neoplasm of breast: Secondary | ICD-10-CM

## 2012-10-16 ENCOUNTER — Other Ambulatory Visit: Payer: Self-pay | Admitting: Internal Medicine

## 2012-10-23 ENCOUNTER — Ambulatory Visit (INDEPENDENT_AMBULATORY_CARE_PROVIDER_SITE_OTHER): Payer: BC Managed Care – PPO | Admitting: Internal Medicine

## 2012-10-23 ENCOUNTER — Ambulatory Visit: Payer: BC Managed Care – PPO

## 2012-10-23 VITALS — BP 128/84 | HR 80 | Temp 98.5°F | Resp 16 | Ht 58.5 in | Wt 205.2 lb

## 2012-10-23 DIAGNOSIS — M25551 Pain in right hip: Secondary | ICD-10-CM

## 2012-10-23 DIAGNOSIS — M25561 Pain in right knee: Secondary | ICD-10-CM

## 2012-10-23 DIAGNOSIS — M25559 Pain in unspecified hip: Secondary | ICD-10-CM

## 2012-10-23 DIAGNOSIS — G4762 Sleep related leg cramps: Secondary | ICD-10-CM

## 2012-10-23 DIAGNOSIS — E119 Type 2 diabetes mellitus without complications: Secondary | ICD-10-CM

## 2012-10-23 DIAGNOSIS — I83893 Varicose veins of bilateral lower extremities with other complications: Secondary | ICD-10-CM

## 2012-10-23 DIAGNOSIS — M25569 Pain in unspecified knee: Secondary | ICD-10-CM

## 2012-10-23 LAB — COMPREHENSIVE METABOLIC PANEL
Albumin: 4.4 g/dL (ref 3.5–5.2)
Alkaline Phosphatase: 115 U/L (ref 39–117)
BUN: 14 mg/dL (ref 6–23)
CO2: 30 mEq/L (ref 19–32)
Glucose, Bld: 291 mg/dL — ABNORMAL HIGH (ref 70–99)
Potassium: 4.3 mEq/L (ref 3.5–5.3)
Sodium: 136 mEq/L (ref 135–145)
Total Bilirubin: 0.4 mg/dL (ref 0.3–1.2)
Total Protein: 7.7 g/dL (ref 6.0–8.3)

## 2012-10-23 LAB — POCT CBC
HCT, POC: 46.2 % (ref 37.7–47.9)
Hemoglobin: 14.8 g/dL (ref 12.2–16.2)
Lymph, poc: 2.8 (ref 0.6–3.4)
MCH, POC: 28.7 pg (ref 27–31.2)
MCHC: 32 g/dL (ref 31.8–35.4)
POC LYMPH PERCENT: 28.8 %L (ref 10–50)
RDW, POC: 15.4 %
WBC: 9.7 10*3/uL (ref 4.6–10.2)

## 2012-10-23 LAB — TSH: TSH: 5.992 u[IU]/mL — ABNORMAL HIGH (ref 0.350–4.500)

## 2012-10-23 LAB — T4, FREE: Free T4: 1.23 ng/dL (ref 0.80–1.80)

## 2012-10-23 LAB — LIPID PANEL: Cholesterol: 255 mg/dL — ABNORMAL HIGH (ref 0–200)

## 2012-10-23 NOTE — Progress Notes (Signed)
  Subjective:    Patient ID: Wendy Curtis, female    DOB: 1960/10/04, 52 y.o.   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 1st  prob legs--noc cramps on L Varicosities both Pain R knee-arth Pain R hip occas rad hip to foot   Fh-dm sister  Review of Systems     Objective:   Physical Exam BP 128/84  Pulse 80  Temp(Src) 98.5 F (36.9 C) (Oral)  Resp 16  Ht 4' 10.5" (1.486 m)  Wt 205 lb 3.2 oz (93.078 kg)  BMI 42.15 kg/m2  SpO2 98% HEENT clear Heart regular without murmur Lungs clear Abdomen obese Extremities-pain with range of motion right hip/pain with range of motion right knee/no effusion/no laxity  Negative neck Murray/negative stress ors Multiple varicosities  all tender No edema Good peripheral pulses Straight leg raise to 90 bilaterally within normal limits No sensory losses distally      Results for orders placed in visit on 10/23/12  POCT CBC      Result Value Range   WBC 9.7  4.6 - 10.2 K/uL   Lymph, poc 2.8  0.6 - 3.4   POC LYMPH PERCENT 28.8  10 - 50 %L   MID (cbc) 0.5  0 - 0.9   POC MID % 4.8  0 - 12 %M   POC Granulocyte 6.4  2 - 6.9   Granulocyte percent 66.4  37 - 80 %G   RBC 5.15  4.04 - 5.48 M/uL   Hemoglobin 14.8  12.2 - 16.2 g/dL   HCT, POC 46.9  62.9 - 47.9 %   MCV 89.8  80 - 97 fL   MCH, POC 28.7  27 - 31.2 pg   MCHC 32.0  31.8 - 35.4 g/dL   RDW, POC 52.8     Platelet Count, POC 277  142 - 424 K/uL   MPV 8.6  0 - 99.8 fL  POCT GLYCOSYLATED HEMOGLOBIN (HGB A1C)      Result Value Range   Hemoglobin A1C 9.7     [facility] reading (PRIMARY) by  Dr. Josephina Gip acute or chronic changes   Assessment & Plan:  Pain, hip, right  Pain, knee, right  Okay for NSAIDs/May need physical therapy/certainly needs weight loss  Uncontrolled Type II or unspecified type diabetes mellitus  C. history hemoglobin A1c brought under control by that but over the last 12  months has fallen  into lack of control again/will attempt 1 pound per week of weight loss by diet in preference  over starting medicines today/followup one month  Vegetables fish and only a little meat/water-lose 1 pound a week  Varicose veins of lower extremities with other complications  She request Ultrasound to rule out deep vein thrombosis because of her recent use of  estrogen hormones  Nocturnal leg cramps

## 2012-10-25 ENCOUNTER — Other Ambulatory Visit: Payer: Self-pay | Admitting: Internal Medicine

## 2012-10-25 DIAGNOSIS — I83893 Varicose veins of bilateral lower extremities with other complications: Secondary | ICD-10-CM

## 2012-10-25 DIAGNOSIS — G4762 Sleep related leg cramps: Secondary | ICD-10-CM

## 2012-11-17 ENCOUNTER — Ambulatory Visit (INDEPENDENT_AMBULATORY_CARE_PROVIDER_SITE_OTHER): Payer: BC Managed Care – PPO | Admitting: Internal Medicine

## 2012-11-17 ENCOUNTER — Encounter: Payer: Self-pay | Admitting: Internal Medicine

## 2012-11-17 DIAGNOSIS — E119 Type 2 diabetes mellitus without complications: Secondary | ICD-10-CM

## 2012-11-17 DIAGNOSIS — IMO0001 Reserved for inherently not codable concepts without codable children: Secondary | ICD-10-CM

## 2012-11-17 DIAGNOSIS — Z0000 Encounter for general adult medical examination without abnormal findings: Secondary | ICD-10-CM

## 2012-11-17 DIAGNOSIS — Z6841 Body Mass Index (BMI) 40.0 and over, adult: Secondary | ICD-10-CM

## 2012-11-17 LAB — COMPREHENSIVE METABOLIC PANEL
AST: 28 U/L (ref 0–37)
Albumin: 3.9 g/dL (ref 3.5–5.2)
Alkaline Phosphatase: 88 U/L (ref 39–117)
BUN: 14 mg/dL (ref 6–23)
Calcium: 9.3 mg/dL (ref 8.4–10.5)
Chloride: 99 mEq/L (ref 96–112)
Glucose, Bld: 156 mg/dL — ABNORMAL HIGH (ref 70–99)
Potassium: 4.2 mEq/L (ref 3.5–5.3)
Sodium: 135 mEq/L (ref 135–145)
Total Protein: 7.1 g/dL (ref 6.0–8.3)

## 2012-11-17 LAB — POCT GLYCOSYLATED HEMOGLOBIN (HGB A1C): Hemoglobin A1C: 9.6

## 2012-11-18 NOTE — Progress Notes (Signed)
Subjective:    Patient ID: Wendy Curtis, female    DOB: 23-Nov-1960, 52 y.o.   MRN: [id number]  HPI here for cpe Patient Active Problem List   Diagnosis Date Noted  . 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 BMI 40.0-44.9, adult 09/12/2011  . Type II or unspecified type diabetes mellitus without mention of complication, uncontrolled 09/12/2011  . Allergic rhinitis 09/12/2011  . Asthma 09/12/2011  . Chronic urticaria 09/12/2011  . Nephrolithiasis 09/12/2011  . ANXIETY 08/27/2009  . PANIC DISORDER 08/27/2009  . DEPRESSION 08/27/2009  . ESOPHAGEAL STRICTURE 08/27/2009  . GERD 08/27/2009  . HIATAL HERNIA 08/27/2009  . SLEEP APNEA 08/27/2009   Current outpatient prescriptions: albuterol (PROVENTIL HFA;VENTOLIN HFA) 108 (90 BASE) MCG/ACT inhaler, Inhale 2 puffs into the lungs every 6 (six) hours as needed. NEED REFILLS, Disp: , Rfl: ;   aspirin 81 MG tablet, Take 81 mg by mouth daily.  , Disp: , Rfl: ;   cetirizine (ZYRTEC) 10 MG tablet, Take 1 tablet (10 mg total) by mouth daily., Disp: 90 tablet, Rfl: 3 Cholecalciferol (VITAMIN D3) 2000 UNITS capsule, Take 2,000 Units by mouth daily.  , Disp: , Rfl: ;  fluticasone (FLONASE) 50 MCG/ACT nasal spray, Place 2 sprays into the nose as needed. NEED REFILLS, Disp: , Rfl: ;   montelukast (SINGULAIR) 10 MG tablet, TAKE 1 TABLET BY MOUTH AT BEDTIME, Disp: 30 tablet, Rfl: 5;   PRILOSEC OTC 20 MG tablet, TAKE 1 TABLET BY MOUTH TWICE A DAY, Disp: 168 tablet, Rfl: 2 Probiotic Product (ALIGN) 4 MG CAPS, Take 1 capsule by mouth daily., Disp: 21 capsule, Rfl: 0;  progesterone (PROMETRIUM) 100 MG capsule, Take 100 mg by mouth. Take 1 tablet every 2 days, Disp: , Rfl: ;  Psyllium Husk POWD, Take 1 dose 2-3 times per week , PER PATIENT TAKE LESS NOW, Disp: , Rfl: ;   MINIVELLE 0.05 MG/24HR, , Disp: , Rfl:   She has started a diet and has lost 4 pounds in the last month She has no new symptoms related to her new onset diabetes She  remains concerned about the pain in her right leg which seems to be located behind the knee, across the lateral aspect of the joint, into the lateral calf///Doppler ultrasound was negative She is awaiting evaluation of varicose veins/some aspects this pain are obviously musculoskeletal tho x-ray was unrevealing//she has occasional hip issues and may be sciatic issues but these seem nonspecific Trip to [facility] pending-cousin there  Review of Systems  Constitutional: Negative for activity change, appetite change and fatigue.  HENT: Negative for hearing loss and neck pain.        Allergies are stable and there have been no episodes of chronic urticaria  Eyes: Negative for visual disturbance.  Respiratory: Negative for shortness of breath.        Asthma has remained stable  Cardiovascular: Negative for chest pain, palpitations and leg swelling.  Gastrointestinal: Negative for abdominal pain, diarrhea and constipation.  Endocrine: Negative for cold intolerance.  Genitourinary: Negative for urgency, frequency, hematuria, difficulty urinating and pelvic pain.       GYN evaluation pending Mammography next week Last Pap 2010 within normal limits Husband with recent prostate cancer and laser surgery and so she has not been sexually active in several months  Musculoskeletal: Positive for myalgias, arthralgias and gait problem. Negative for back pain and joint swelling.  Skin: Negative for rash.  Neurological: Negative for weakness, light-headedness and headaches.  Hematological: Does not bruise/bleed easily.  Psychiatric/Behavioral: Negative for dysphoric mood and agitation.       Objective:   Physical Exam BP 133/89  Pulse 76  Temp(Src) 98.2 F (36.8 C) (Oral)  Resp 16  Ht 4' 10.5" (1.486 m)  Wt 202 lb 8 oz (91.853 kg)  BMI 41.6 kg/m2  SpO2 98% HEENT clear No thyromegaly or lymphadenopathy Heart regular without murmur Lungs clear Abdomen obese soft nontender without organomegaly or  masses Pelvic-clear introitus/os clear/uterus mid position nontender/no adnexal tenderness or masses///exam was very difficult due to  obesity and 2 tenderness in stretching the vaginal vault to get an adequate view of the cervix Spine straight/ nontender over lumbar area Hips good range of motion Tender popliteal fossa on the right with tenderness in lateral joint line but no laxity and tenderness into several areas of the calf Varicosities abundant No edema Good peripheral pulses Straight leg raise to 90 intact bilaterally Gait antalgic for first several steps due to the right knee Neurological intact      Assessment & Plan:  CPE Pain, lower leg, right-will refer to Dr. Althea Charon for further evaluation/has appointment with vascular specialist next week  Routine general medical examination at a health care facility - Plan: Pap IG and HPV (high risk) DNA detection///to have ultrasound next week at GYN  DM (diabetes mellitus) -uncontrolled- Plan: Comprehensive metabolic panel, Microalbumin, urine, POCT glycosylated hemoglobin (Hb A1C)  Type II or unspecified type diabetes mellitus without mention of complication, uncontrolled  BMI 40.0-44.9, adult  She will not start medication at this point for diabetes and insists that she will lose 1 pound a week with retesting in 3-6 months

## 2012-11-19 ENCOUNTER — Other Ambulatory Visit: Payer: Self-pay | Admitting: Internal Medicine

## 2012-11-22 ENCOUNTER — Encounter: Payer: Self-pay | Admitting: Internal Medicine

## 2012-11-22 LAB — PAP IG AND HPV HIGH-RISK: HPV DNA High Risk: NOT DETECTED

## 2012-11-23 ENCOUNTER — Ambulatory Visit
Admission: RE | Admit: 2012-11-23 | Discharge: 2012-11-23 | Disposition: A | Discharge status: Home / Self Care | Payer: BC Managed Care – PPO | Source: Ambulatory Visit | Attending: Internal Medicine | Admitting: Internal Medicine

## 2012-11-23 ENCOUNTER — Ambulatory Visit
Admission: RE | Admit: 2012-11-23 | Discharge: 2012-11-23 | Disposition: A | Discharge status: Home / Self Care | Payer: BC Managed Care – PPO | Source: Ambulatory Visit

## 2012-11-23 ENCOUNTER — Encounter: Payer: Self-pay | Admitting: Internal Medicine

## 2012-11-23 DIAGNOSIS — I83893 Varicose veins of bilateral lower extremities with other complications: Secondary | ICD-10-CM

## 2012-11-23 DIAGNOSIS — G4762 Sleep related leg cramps: Secondary | ICD-10-CM

## 2012-11-23 DIAGNOSIS — Z1231 Encounter for screening mammogram for malignant neoplasm of breast: Secondary | ICD-10-CM

## 2012-11-26 ENCOUNTER — Telehealth: Payer: Self-pay

## 2012-11-26 NOTE — Telephone Encounter (Signed)
Patient states she was seen on June 11 by Dr. Merla Riches and was supposed to have had a lipid panel done but she didn't see it in her lab results. Can someone look into this for her? CB# [phone] (cell)

## 2012-11-26 NOTE — Telephone Encounter (Signed)
Lipid panel was done 1 month ago, not due to be repeated yet.

## 2012-11-26 NOTE — Telephone Encounter (Signed)
Patient advised.

## 2012-12-02 ENCOUNTER — Telehealth: Payer: Self-pay | Admitting: Emergency Medicine

## 2012-12-02 NOTE — Telephone Encounter (Signed)
CALLED PT TO MAKE HER AWARE THAT HER [employer] REQUIRES OF CONSERVATIVE [place] FOR VARICOSE VEINS- WE WILL CALL HER TO MAKE AN APPT FOR 02-23-13 OR AFTER.

## 2012-12-11 ENCOUNTER — Other Ambulatory Visit: Payer: Self-pay | Admitting: Internal Medicine

## 2012-12-11 ENCOUNTER — Other Ambulatory Visit: Payer: Self-pay | Admitting: Physician Assistant

## 2013-01-13 ENCOUNTER — Other Ambulatory Visit (HOSPITAL_COMMUNITY): Payer: Self-pay | Admitting: Interventional Radiology

## 2013-01-13 DIAGNOSIS — I8393 Asymptomatic varicose veins of bilateral lower extremities: Secondary | ICD-10-CM

## 2013-01-19 ENCOUNTER — Ambulatory Visit: Payer: BC Managed Care – PPO | Admitting: Internal Medicine

## 2013-02-24 ENCOUNTER — Inpatient Hospital Stay: Admission: RE | Admit: 2013-02-24 | Payer: BC Managed Care – PPO | Source: Ambulatory Visit

## 2013-03-02 ENCOUNTER — Telehealth: Payer: Self-pay | Admitting: Emergency Medicine

## 2013-03-02 NOTE — Telephone Encounter (Signed)
LMOVM ON CELL # TO SEE IF PT WANTED TO R/S HER MISSED APPT. FROM 02-24-13.

## 2013-03-07 ENCOUNTER — Telehealth: Payer: Self-pay

## 2013-03-07 DIAGNOSIS — M25569 Pain in unspecified knee: Secondary | ICD-10-CM

## 2013-03-07 NOTE — Telephone Encounter (Signed)
Patient would like a referral to Dr. Maurine Minister Ang at [facility]. She was referred by Dr. Merla Riches to [facility] but they are not meeting her needs and would like referral to [facility]. She can call to schedule appointment once referral has been sent over.

## 2013-03-08 NOTE — Telephone Encounter (Signed)
The referral to [facility] has been made by Dr Merla Riches. Will call patient to advise

## 2013-03-08 NOTE — Telephone Encounter (Signed)
Have left message for patient to advise she will get a call to schedule, referral has been made

## 2013-03-10 ENCOUNTER — Encounter: Payer: Self-pay | Admitting: Emergency Medicine

## 2013-05-03 ENCOUNTER — Telehealth: Payer: Self-pay

## 2013-05-03 NOTE — Telephone Encounter (Signed)
Pt was referred by Merla Riches to Dr. Allena Napoleon at  [facility]  She says they are requesting her past 63-month medical history with [place].  She wants to come pick these up herself.  Please call when ready. [phone]

## 2013-05-04 NOTE — Telephone Encounter (Signed)
Patient notified by phone to pick up records at [address] front desk. Patient says she will come Monday afternoon to pick up after the holidays (Thanksgiving)

## 2013-05-10 ENCOUNTER — Other Ambulatory Visit: Payer: Self-pay | Admitting: Physician Assistant

## 2013-05-13 DIAGNOSIS — M170 Bilateral primary osteoarthritis of knee: Secondary | ICD-10-CM | POA: Insufficient documentation

## 2013-06-19 ENCOUNTER — Other Ambulatory Visit: Payer: Self-pay | Admitting: Internal Medicine

## 2013-08-15 ENCOUNTER — Other Ambulatory Visit: Payer: Self-pay | Admitting: Physician Assistant

## 2013-08-18 ENCOUNTER — Other Ambulatory Visit: Payer: Self-pay

## 2013-08-18 NOTE — Telephone Encounter (Signed)
Pt CB and stated that Dr Laney Pastor normally RFs her meds for 1 year. We had denied RF since pt last seen on 11/17/12. Pt needs RF of singulair. Dr Laney Pastor can we RF for the rest of the year? What about her other meds if she reqs them?

## 2013-08-19 MED ORDER — MONTELUKAST SODIUM 10 MG PO TABS: ORAL_TABLET | ORAL | Status: DC

## 2013-08-19 NOTE — Telephone Encounter (Signed)
Notified pt on VM that RFs were sent for 3 mos per Dr Doolittle's OK to fill for remainder of year until due for CPE. Advise pt to go ahead and sch appt in order to get an appt when needed.

## 2013-11-08 ENCOUNTER — Other Ambulatory Visit: Payer: Self-pay | Admitting: Family Medicine

## 2013-11-08 DIAGNOSIS — R109 Unspecified abdominal pain: Secondary | ICD-10-CM

## 2013-11-11 ENCOUNTER — Encounter: Payer: Self-pay | Admitting: *Deleted

## 2013-11-21 ENCOUNTER — Other Ambulatory Visit: Payer: Self-pay

## 2013-11-21 DIAGNOSIS — Z1231 Encounter for screening mammogram for malignant neoplasm of breast: Secondary | ICD-10-CM

## 2013-11-25 ENCOUNTER — Ambulatory Visit
Admission: RE | Admit: 2013-11-25 | Discharge: 2013-11-25 | Disposition: A | Discharge status: Home / Self Care | Payer: BC Managed Care – PPO | Source: Ambulatory Visit

## 2013-11-25 ENCOUNTER — Ambulatory Visit
Admission: RE | Admit: 2013-11-25 | Discharge: 2013-11-25 | Disposition: A | Discharge status: Home / Self Care | Payer: BC Managed Care – PPO | Source: Ambulatory Visit | Attending: Family Medicine | Admitting: Family Medicine

## 2013-11-25 ENCOUNTER — Encounter (INDEPENDENT_AMBULATORY_CARE_PROVIDER_SITE_OTHER): Payer: Self-pay

## 2013-11-25 DIAGNOSIS — Z1231 Encounter for screening mammogram for malignant neoplasm of breast: Secondary | ICD-10-CM

## 2013-11-25 DIAGNOSIS — R109 Unspecified abdominal pain: Secondary | ICD-10-CM

## 2013-12-16 ENCOUNTER — Encounter: Payer: Self-pay | Admitting: Internal Medicine

## 2013-12-16 ENCOUNTER — Ambulatory Visit (INDEPENDENT_AMBULATORY_CARE_PROVIDER_SITE_OTHER): Payer: BC Managed Care – PPO | Admitting: Internal Medicine

## 2013-12-16 VITALS — BP 118/68 | HR 76 | Ht <= 58 in | Wt 198.0 lb

## 2013-12-16 DIAGNOSIS — K219 Gastro-esophageal reflux disease without esophagitis: Secondary | ICD-10-CM

## 2013-12-16 NOTE — Progress Notes (Signed)
Wendy Curtis Apr 06, 1961 [id number]  Note: This dictation was prepared with Dragon digital system. Any transcriptional errors that result from this procedure are unintentional.   History of Present Illness:  This is a 53 year old Female with history of severe gastroesophageal reflux and family history of gastric cancer in her brother. She has a large 5 cm hiatal hernia found on multiple upper endoscopies. Her last exam in June 2011 again showed a large hiatal hernia and mild nonobstructing stricture. Biopsies from the squamocolumnar junction did not show any evidence of Barrett's esophagus. She is up-to-date on her colonoscopy. Her last exam in June 2011 showed a normal colon. She is status post remote cholecystectomy. Her last ultrasound on file was in 2005. She also reports having a recent abdominal ultrasound by Dr. Rolland Bimler.    Past Medical History  Diagnosis Date  . Esophageal stricture   . Anxiety   . Panic disorder   . Sleep apnea   . Hiatal hernia   . GERD (gastroesophageal reflux disease)   . Allergic rhinitis   . Hepatic steatosis   . Fundic gland polyps of stomach, benign   . DM (diabetes mellitus)     Past Surgical History  Procedure Laterality Date  . Tonsillectomy    . Dilation and curettage of uterus    . Cholecystectomy      Allergies  Allergen Reactions  . Celebrex [Celecoxib]     Severe GI upset (pain)  . Meloxicam     Tongue sweeling  . Penicillins     REACTION: rash  . Procaine Hcl Other (See Comments)    Jittery, sweating    Family history and social history have been reviewed.  Review of Systems: Occasional dysphagia to solids less than once a month. Denies heartburn. Recent exacerbation of left upper quadrant abdominal pain he attributes it does having to sleep in the flat position because her bed at home was moved to another room as there were painting the house and she couldn't elevate the head of the bed at night  The remainder of the 10 point  ROS is negative except as outlined in the H&P  Physical Exam: General Appearance Well developed, in no distress Eyes  Non icteric  HEENT  Non traumatic, normocephalic  Mouth No lesion, tongue papillated, no cheilosis Neck Supple without adenopathy, thyroid not enlarged, no carotid bruits, no JVD Lungs Clear to auscultation bilaterally COR Normal S1, normal S2, regular rhythm, no murmur, quiet precordium Abdomen obese soft with minimal tenderness in the subxiphoid area. Normal active bowel sounds Rectal not done Extremities  No pedal edema Skin No lesions Neurological Alert and oriented x 3 Psychological Normal mood and affect  Assessment and Plan:   Problem #52 53 year old female with chronic gastroesophageal reflux disease and large hiatal hernia. She had a recent exacerbation after failing to comply with antireflux measures. I would consider switching her from Prilosec to Protonix but she is doing much better now and prefers to stay on Prilosec because her insurance covers it well. She will continue Carafate slurry, given by Dr. Rolland Bimler. We discussed Reglan but I would hold off in view of possible CNS side effects. She will elevate the head of the bed at this point and she will be scheduled for an upper endoscopy with biopsies to rule out Barrett's esophagus.    [name] 12/16/2013

## 2013-12-16 NOTE — Patient Instructions (Addendum)
You have been scheduled for an endoscopy. Please follow written instructions given to you at your visit today. If you use inhalers (even only as needed), please bring them with you on the day of your procedure. Your physician has requested that you go to www.startemmi.com and enter the access code given to you at your visit today. This web site gives a general overview about your procedure. However, you should still follow specific instructions given to you by our office regarding your preparation for the procedure.  Your physician has requested that you go to the basement for the following lab work before leaving today: B12 level  CC: Dr Marin Comment IM

## 2013-12-19 ENCOUNTER — Other Ambulatory Visit (INDEPENDENT_AMBULATORY_CARE_PROVIDER_SITE_OTHER): Payer: BC Managed Care – PPO

## 2013-12-19 ENCOUNTER — Encounter: Payer: Self-pay | Admitting: Internal Medicine

## 2013-12-19 DIAGNOSIS — K219 Gastro-esophageal reflux disease without esophagitis: Secondary | ICD-10-CM

## 2013-12-20 LAB — VITAMIN B12: VITAMIN B 12: 422 pg/mL (ref 211–911)

## 2014-01-04 ENCOUNTER — Encounter: Payer: Self-pay | Admitting: *Deleted

## 2014-01-04 ENCOUNTER — Telehealth: Payer: Self-pay | Admitting: *Deleted

## 2014-01-04 ENCOUNTER — Other Ambulatory Visit: Payer: Self-pay | Admitting: *Deleted

## 2014-01-04 DIAGNOSIS — K219 Gastro-esophageal reflux disease without esophagitis: Secondary | ICD-10-CM

## 2014-01-04 NOTE — Telephone Encounter (Signed)
Spoke with patient and she can have her EGD on 01/06/14 at 11:30 AM. Cancelled procedure at [facility] on 01/19/14.

## 2014-01-04 NOTE — Telephone Encounter (Signed)
Left a message for patient to call. (opening on Friday for EGD)

## 2014-01-06 ENCOUNTER — Encounter: Payer: Self-pay | Admitting: Internal Medicine

## 2014-01-06 ENCOUNTER — Ambulatory Visit (AMBULATORY_SURGERY_CENTER): Payer: BC Managed Care – PPO | Admitting: Internal Medicine

## 2014-01-06 VITALS — BP 113/76 | HR 71 | Temp 98.0°F | Resp 17 | Ht <= 58 in | Wt 198.0 lb

## 2014-01-06 DIAGNOSIS — D131 Benign neoplasm of stomach: Secondary | ICD-10-CM

## 2014-01-06 DIAGNOSIS — K219 Gastro-esophageal reflux disease without esophagitis: Secondary | ICD-10-CM

## 2014-01-06 DIAGNOSIS — K222 Esophageal obstruction: Secondary | ICD-10-CM

## 2014-01-06 DIAGNOSIS — K2100 Gastro-esophageal reflux disease with esophagitis, without bleeding: Secondary | ICD-10-CM

## 2014-01-06 MED ORDER — SODIUM CHLORIDE 0.9 % IV SOLN: 500.0000 mL | INTRAVENOUS | Status: DC

## 2014-01-06 NOTE — Patient Instructions (Addendum)
YOU HAD AN ENDOSCOPIC PROCEDURE TODAY AT [facility]: Refer to the procedure report that was given to you for any specific questions about what was found during the examination.  If the procedure report does not answer your questions, please call your gastroenterologist to clarify.  If you requested that your care partner not be given the details of your procedure findings, then the procedure report has been included in a sealed envelope for you to review at your convenience later.  YOU SHOULD EXPECT: Some feelings of bloating in the abdomen. Passage of more gas than usual.  Walking can help get rid of the air that was put into your GI tract during the procedure and reduce the bloating. If you had a lower endoscopy (such as a colonoscopy or flexible sigmoidoscopy) you may notice spotting of blood in your stool or on the toilet paper. If you underwent a bowel prep for your procedure, then you may not have a normal bowel movement for a few days.  DIET: Follow Dilation Handout.   ACTIVITY: Your care partner should take you home directly after the procedure.  You should plan to take it easy, moving slowly for the rest of the day.  You can resume normal activity the day after the procedure however you should NOT DRIVE or use heavy machinery for 24 hours (because of the sedation medicines used during the test).    SYMPTOMS TO REPORT IMMEDIATELY: A gastroenterologist can be reached at any hour.  During normal business hours, 8:30 AM to 5:00 PM Monday through Friday, call [phone].  After hours and on weekends, please call the GI answering service at [phone] who will take a message and have the physician on call contact you.    Following upper endoscopy (EGD)  Vomiting of blood or coffee ground material  New chest pain or pain under the shoulder blades  Painful or persistently difficult swallowing  New shortness of breath  Fever of 100F or higher  Black, tarry-looking  stools  FOLLOW UP: If any biopsies were taken you will be contacted by phone or by letter within the next 1-3 weeks.  Call your gastroenterologist if you have not heard about the biopsies in 3 weeks.  Our staff will call the home number listed on your records the next business day following your procedure to check on you and address any questions or concerns that you may have at that time regarding the information given to you following your procedure. This is a courtesy call and so if there is no answer at the home number and we have not heard from you through the emergency physician on call, we will assume that you have returned to your regular daily activities without incident.  SIGNATURES/CONFIDENTIALITY: You and/or your care partner have signed paperwork which will be entered into your electronic medical record.  These signatures attest to the fact that that the information above on your After Visit Summary has been reviewed and is understood.  Full responsibility of the confidentiality of this discharge information lies with you and/or your care-partner.  Resume medications. Information given on dilation diet and esophagitis with discharge instructions.

## 2014-01-06 NOTE — Progress Notes (Signed)
A/ox3 pleased with MAC, report to Sheila RN 

## 2014-01-06 NOTE — Progress Notes (Signed)
1240  During procedure airway supported with chin lift to maintain 02 SATS > 94

## 2014-01-06 NOTE — Op Note (Signed)
[facility]  [address] [place], [id number]   ENDOSCOPY PROCEDURE REPORT  PATIENT: Wendy Curtis, Wendy Curtis  MR#: [id number] BIRTHDATE: 09/16/1960 , 53  yrs. old GENDER: Female ENDOSCOPIST: Lafayette Dragon, MD REFERRED BY:  Arnetha Courser, M.D. PROCEDURE DATE:  01/06/2014 PROCEDURE:  EGD w/ biopsy and Savary dilation of esophagus ASA CLASS:     Class II INDICATIONS:  Dysphagia.   Epigastric pain.   history of large hiatal hernia.  Last upper endoscopy June 2011.  Positive family history of gastric cancer in patient's brother. MEDICATIONS: MAC sedation, administered by CRNA and propofol (Diprivan) 200mg  IV TOPICAL ANESTHETIC: none  DESCRIPTION OF PROCEDURE: After the risks benefits and alternatives of the procedure were thoroughly explained, informed consent was obtained.  The LB [id number] endoscope was introduced through the mouth and advanced to the second portion of the duodenum. Without limitations.  The instrument was slowly withdrawn as the mucosa was fully examined.      Esophagus: esophageal mucosa was normal in the proximal and mid esophagus. There was a benign appearing nonobstructing esophageal stricture allowed the endoscope to traverse into a large hiatal hernia. There was a short erosion at the GE junction consistent with grade 1 esophagitis. Biopsies were obtained to rule out Barrett's esophagus  Stomach: there was a large 5 cm hiatal hernia which extended from 30-35 cm from the incisors. Gastric Guerry Bruin were normal. There was a 1 cm sessile polyp at the junction of the gastric antrum and gastric body multiple biopsies were obtained to rule out dysplasia, the polyp felt firm. Gastric outlet was normal. Retroflexion of the endoscope confirmed presence of hiatal hernia  Duodenum: duodenal wall and descending duodenum was normal  Savary dilators passed esophagus into the stomach over a guidewire. 15 mm and 16 mm dilators passed with the mild  resistance. There was small amount of blood on the dilator[         The scope was then withdrawn from the patient and the procedure completed.  COMPLICATIONS: There were no complications. ENDOSCOPIC IMPRESSION:  grade 1 reflux esophagitis Nonobstructing benign-appearing distal esophageal stricture  dilated with 15 and 16 mm Savary dilators 5 cm large nonreducible hiatal hernia unchanged from prior exam Gastric polyp status post biopsies to rule out dysplasia  RECOMMENDATIONS: 1.  Anti-reflux regimen to be follow 2.  Await pathology results 3.  Continue PPI  REPEAT EXAM: for EGD .   Marland Kitchen  Recall endoscopy pending biopsy results  eSigned:  Lafayette Dragon, MD 01/06/2014 12:56 PM   CC:  PATIENT NAME:  Wendy Curtis MR#: [id number]

## 2014-01-06 NOTE — Progress Notes (Signed)
Spoke with husband and pt concerning chin lift; they were understanding and appreciative of anesthestic care.  Pt encouraged to take acetamenaphen if necessary for jaw soreness

## 2014-01-06 NOTE — Progress Notes (Signed)
Called to room to assist during endoscopic procedure.  Patient ID and intended procedure confirmed with present staff. Received instructions for my participation in the procedure from the performing physician.  

## 2014-01-09 ENCOUNTER — Telehealth: Payer: Self-pay

## 2014-01-09 NOTE — Telephone Encounter (Signed)
Left message on answering machine. 

## 2014-01-11 ENCOUNTER — Encounter: Payer: Self-pay | Admitting: *Deleted

## 2014-01-11 ENCOUNTER — Encounter: Payer: Self-pay | Admitting: Internal Medicine

## 2014-01-19 ENCOUNTER — Encounter (HOSPITAL_COMMUNITY): Admission: RE | Payer: Self-pay | Source: Ambulatory Visit

## 2014-01-19 ENCOUNTER — Ambulatory Visit (HOSPITAL_COMMUNITY)
Admission: RE | Admit: 2014-01-19 | Payer: BC Managed Care – PPO | Source: Ambulatory Visit | Admitting: Internal Medicine

## 2014-01-19 SURGERY — EGD (ESOPHAGOGASTRODUODENOSCOPY)
Anesthesia: Moderate Sedation

## 2014-01-20 ENCOUNTER — Institutional Professional Consult (permissible substitution): Payer: BC Managed Care – PPO | Admitting: Cardiovascular Disease

## 2014-01-25 IMAGING — CR DG ABDOMEN 1V
1 series · 1 of 1 positions shown · non-contrast
Comparison: None.

CLINICAL DATA: Left upper quadrant pain.

ABDOMEN - 1 VIEW

[AP]
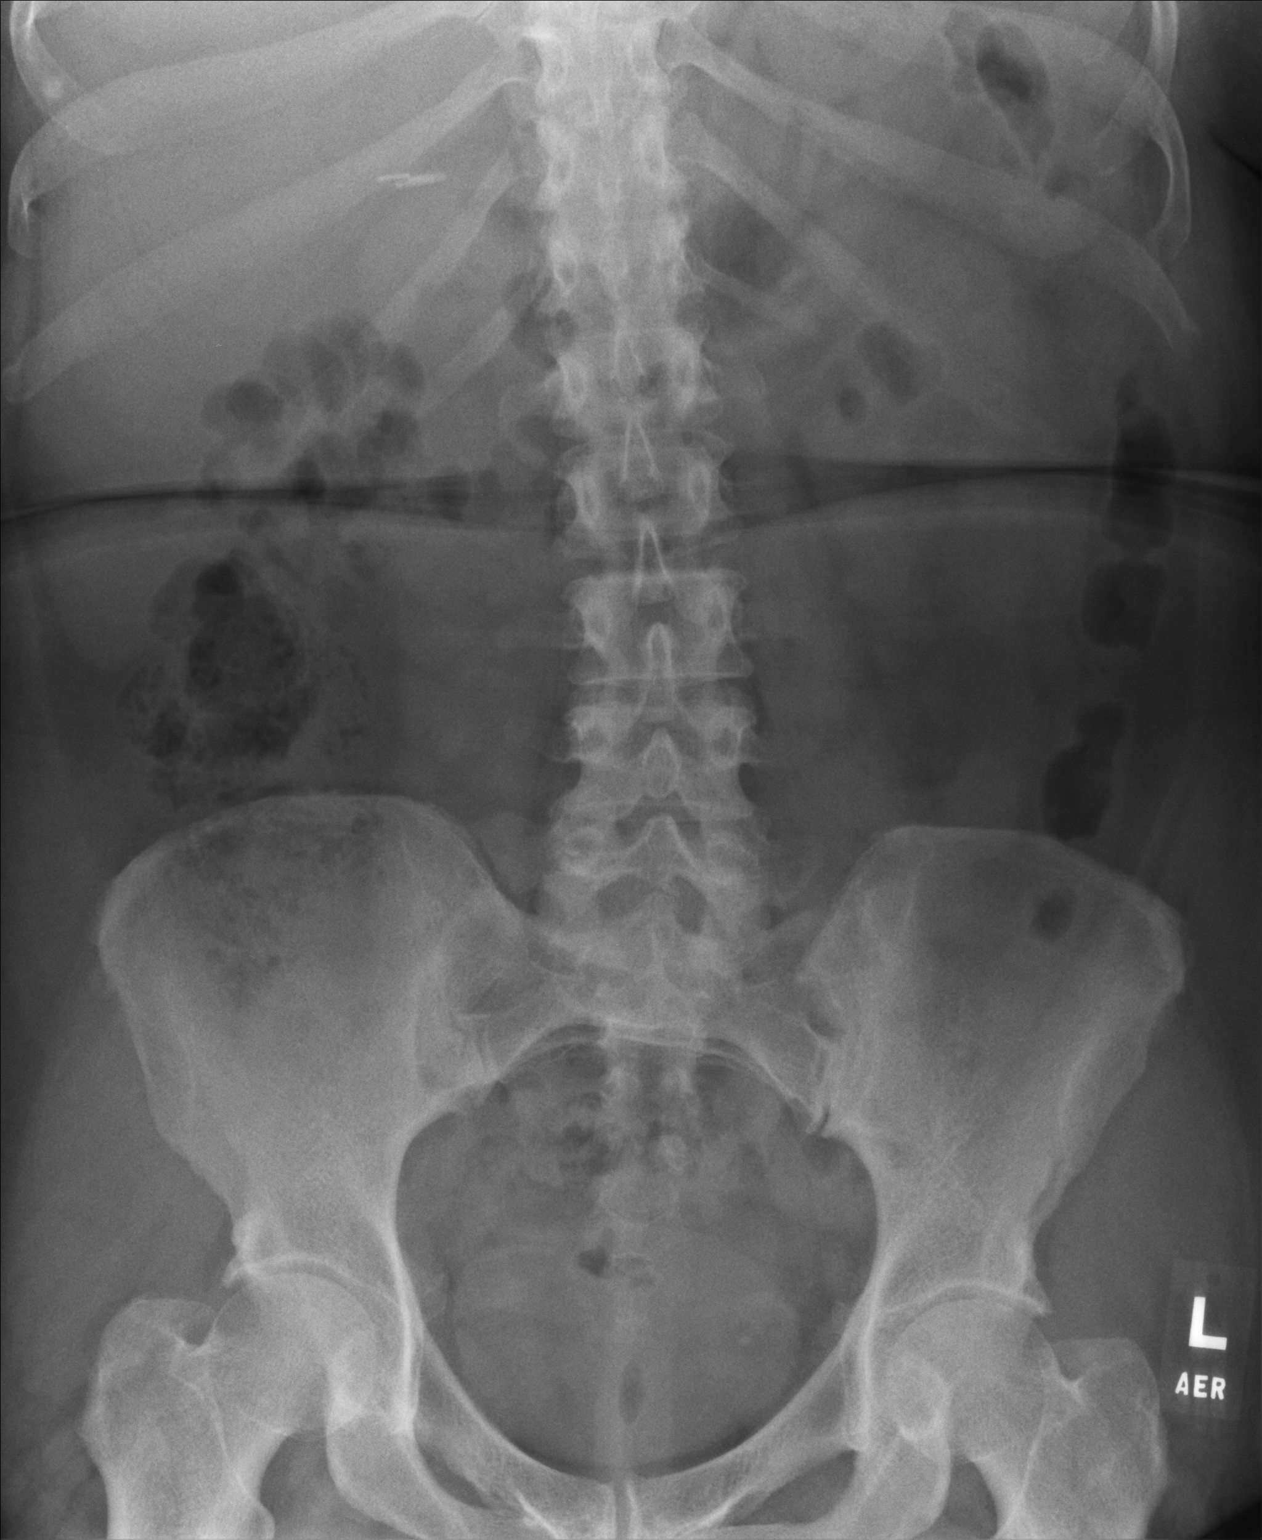

[1 of 1 positions shown; findings below may reference images not displayed]

FINDINGS: Bowel gas pattern is normal.  Slight lumbar scoliosis.
Evidence of prior cholecystectomy.  Small calcification in the left
side of the pelvis consistent with a phlebolith.
IMPRESSION: Benign-appearing abdomen.

## 2014-01-25 IMAGING — CR DG THORACIC SPINE 2V
2 series · 2 of 2 positions shown · non-contrast
Comparison: None.

CLINICAL DATA: Thoracic spine pain.

THORACIC SPINE - 2 VIEW

[AP]
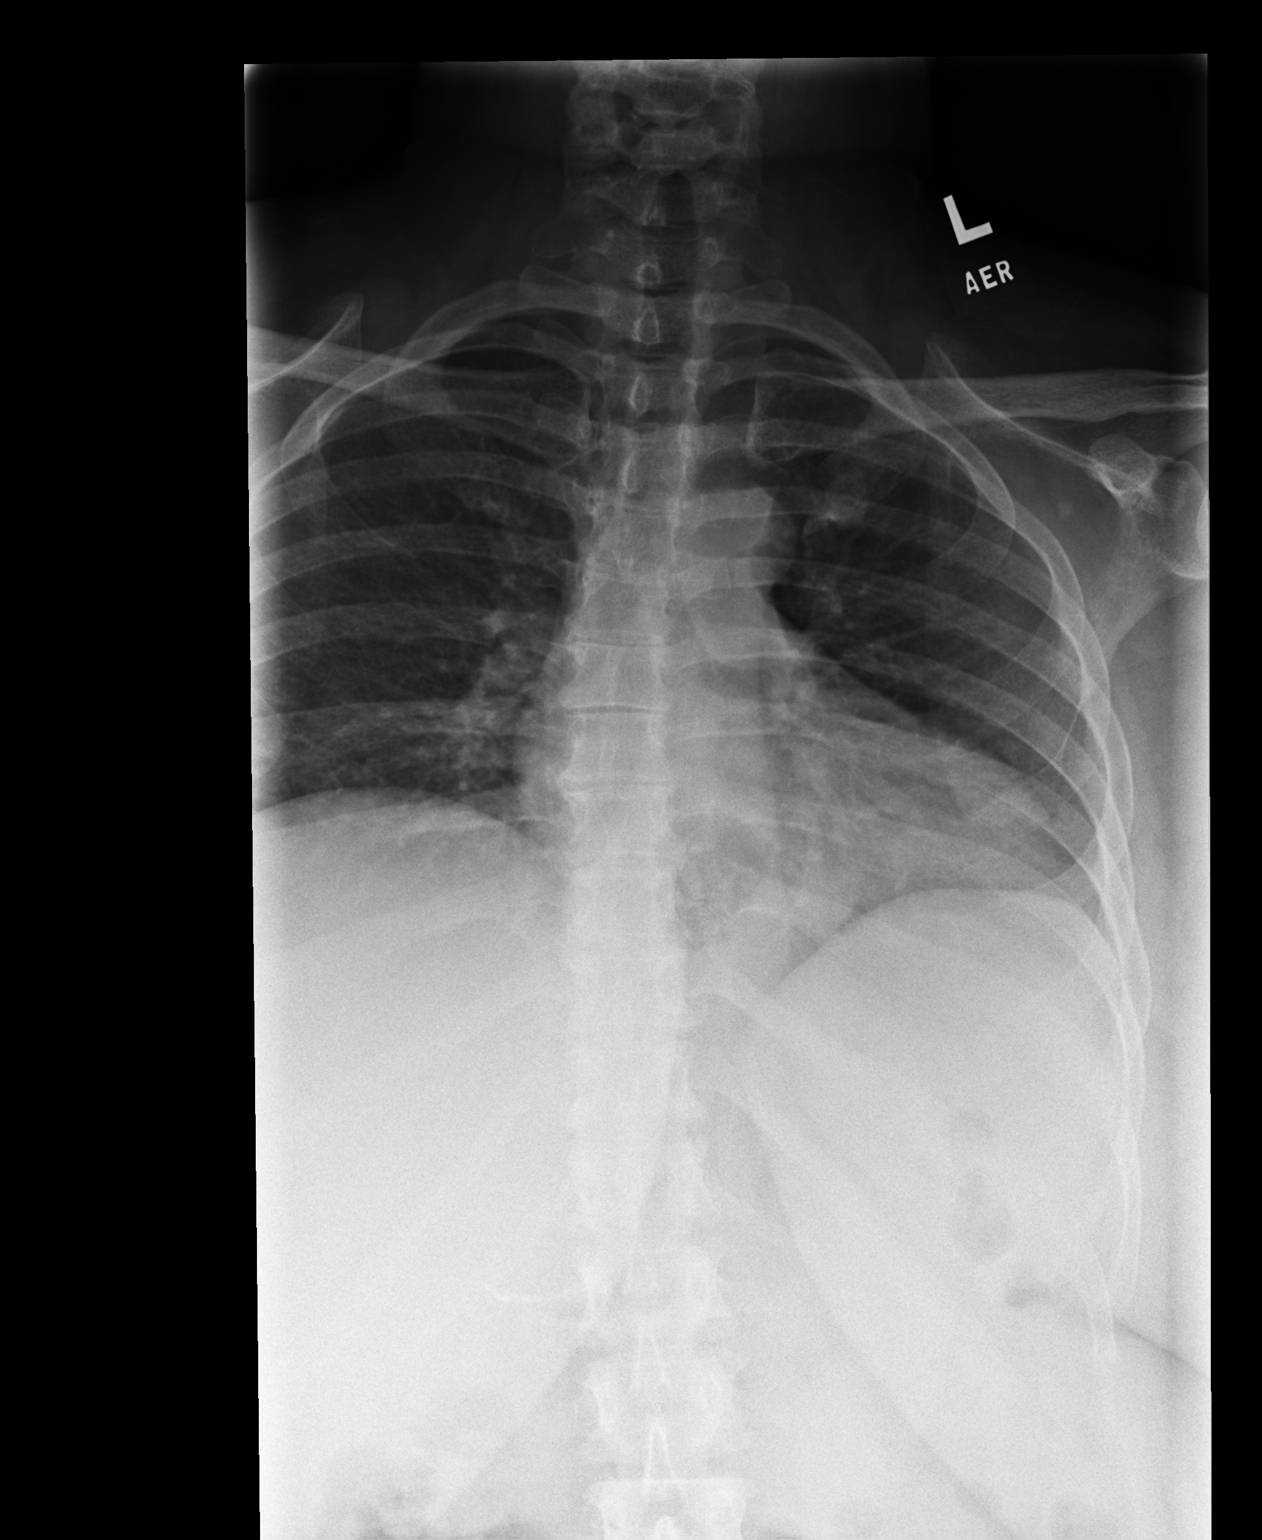

[lateral]
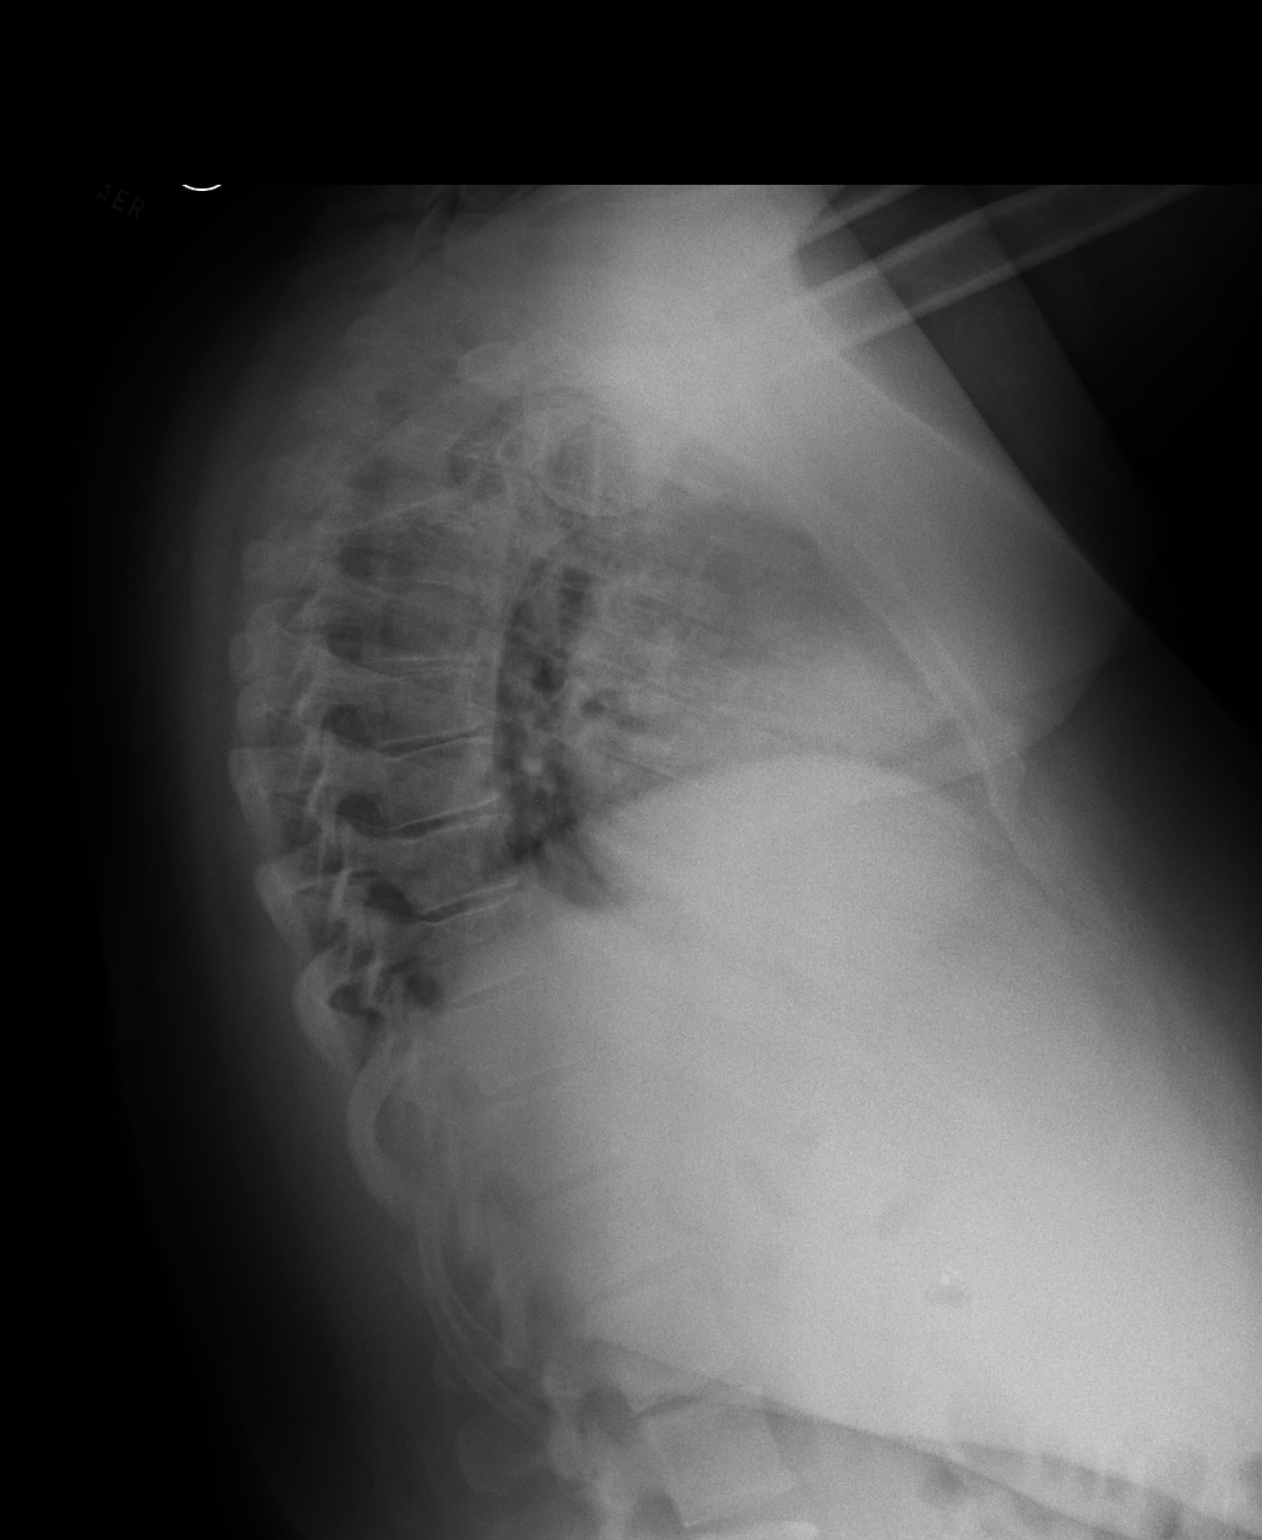

[2 of 2 positions shown; findings below may reference images not displayed]

FINDINGS: There is a slight thoracic scoliosis with convexity to
the right.  Slight degenerative changes of the disc spaces.  No
fracture or bone destruction or subluxation. Large hiatal hernia.
IMPRESSION: Slight thoracic scoliosis.  No acute abnormalities.

## 2014-05-23 ENCOUNTER — Other Ambulatory Visit: Payer: Self-pay | Admitting: Internal Medicine

## 2014-12-04 ENCOUNTER — Other Ambulatory Visit: Payer: Self-pay

## 2014-12-14 ENCOUNTER — Other Ambulatory Visit: Payer: Self-pay

## 2014-12-27 ENCOUNTER — Encounter: Payer: Self-pay | Admitting: Internal Medicine

## 2015-01-10 ENCOUNTER — Encounter: Payer: Self-pay | Admitting: Internal Medicine

## 2015-01-10 ENCOUNTER — Ambulatory Visit (INDEPENDENT_AMBULATORY_CARE_PROVIDER_SITE_OTHER): Payer: BC Managed Care – PPO | Admitting: Internal Medicine

## 2015-01-10 VITALS — BP 100/70 | HR 76 | Ht <= 58 in | Wt 203.1 lb

## 2015-01-10 DIAGNOSIS — K219 Gastro-esophageal reflux disease without esophagitis: Secondary | ICD-10-CM | POA: Diagnosis not present

## 2015-01-10 DIAGNOSIS — K2100 Gastro-esophageal reflux disease with esophagitis, without bleeding: Secondary | ICD-10-CM

## 2015-01-10 DIAGNOSIS — K222 Esophageal obstruction: Secondary | ICD-10-CM

## 2015-01-10 MED ORDER — OMEPRAZOLE MAGNESIUM 20 MG PO TBEC: 20.0000 mg | DELAYED_RELEASE_TABLET | Freq: Two times a day (BID) | ORAL | Status: DC

## 2015-01-10 MED ORDER — RANITIDINE HCL 300 MG PO CAPS: 300.0000 mg | ORAL_CAPSULE | ORAL | Status: DC | PRN

## 2015-01-10 MED ORDER — SUCRALFATE 1 GM/10ML PO SUSP: 1.0000 g | Freq: Two times a day (BID) | ORAL | Status: DC

## 2015-01-10 NOTE — Progress Notes (Signed)
Wendy Curtis September 30, 1960 [id number]  Note: This dictation was prepared with Dragon digital system. Any transcriptional errors that result from this procedure are unintentional.   History of Present Illness: This is a 54 year old female with chronic gastroesophageal reflux and large hiatal hernia confirmed on several upper endoscopies. Last one in July 2015 which showed mild esophageal stricture and 5 cm hiatal hernia extending from 30-35 cm from the incisors. Savary dilator 15 and 16 mm passed without difficulty. There is no evidence of Barrett's esophagus. Her brother died of gastric cancer. She is up-to-date on colonoscopy which was done in June 2011. She is post cholecystectomy. She comes today with concerns about long term side effects of PPIs. She is on Prilosec 20 mg twice a day. . Her symptoms are well controlled on the current regimen. She denies recurrent dysphagia. On occasion she has interscapular pain which she relates to hiatal hernia. 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 She takes Carafate slurry 10 cc by mouth twice a day when necessary exacerbation    Past Medical History  Diagnosis Date  . Esophageal stricture   . Anxiety   . Panic disorder   . Sleep apnea   . Hiatal hernia   . GERD (gastroesophageal reflux disease)   . Allergic rhinitis   . Hepatic steatosis   . Fundic gland polyps of stomach, benign   . DM (diabetes mellitus)   . Hypothyroid     Past Surgical History  Procedure Laterality Date  . Tonsillectomy    . Dilation and curettage of uterus    . Cholecystectomy      Allergies  Allergen Reactions  . Celebrex [Celecoxib]     Severe GI upset (pain)  . Meloxicam     Tongue sweeling  . Penicillins     REACTION: rash  . Procaine Hcl Other (See Comments)    Jittery, sweating    Family history and social history have been reviewed.  Review of Systems: Negative for dysphagia odynophagia  The remainder of the 10 point ROS is negative  except as outlined in the H&P  Physical Exam: General Appearance Well developed, in no distress Eyes  Non icteric  HEENT  Non traumatic, normocephalic  Mouth No lesion, tongue papillated, no cheilosis Neck Supple without adenopathy, thyroid not enlarged, no carotid bruits, no JVD Lungs Clear to auscultation bilaterally COR Normal S1, normal S2, regular rhythm, no murmur, quiet precordium Abdomen soft obese with normoactive bowel sounds Rectal not done Extremities  No pedal edema Skin No lesions Neurological Alert and oriented x 3 Psychological Normal mood and affect  Assessment and Plan:   54 year old female with a large 5 cm nonreducible hiatal hernia currently under good control with the omeprazole 20 mg twice a day. No symptoms of LPR. We have discussed concerns about all PPIs. We will substitute ranitidine 300 mg for one of the Prilosec. If she has exacerbation she will return back to Prilosec twice a day. We will also refill Carafate slurry to take on when necessary basis. She will continue antireflux measures with include the weight loss. She will transferred to care of Dr.Nandigam     Wendy Curtis 01/10/2015

## 2015-01-10 NOTE — Patient Instructions (Addendum)
We have sent medications to your pharmacy for you to pick up at your convenience.  Dr Wayland Denis

## 2015-02-15 ENCOUNTER — Encounter: Payer: BC Managed Care – PPO | Admitting: Obstetrics and Gynecology

## 2015-02-22 ENCOUNTER — Ambulatory Visit: Payer: BC Managed Care – PPO | Admitting: Internal Medicine

## 2015-04-14 ENCOUNTER — Emergency Department (HOSPITAL_COMMUNITY)
Admission: EM | Admit: 2015-04-14 | Discharge: 2015-04-14 | Disposition: A | Discharge status: Home / Self Care | Payer: BC Managed Care – PPO | Attending: Emergency Medicine | Admitting: Emergency Medicine

## 2015-04-14 ENCOUNTER — Encounter (HOSPITAL_COMMUNITY): Payer: Self-pay

## 2015-04-14 ENCOUNTER — Emergency Department (HOSPITAL_COMMUNITY): Payer: BC Managed Care – PPO

## 2015-04-14 DIAGNOSIS — E119 Type 2 diabetes mellitus without complications: Secondary | ICD-10-CM | POA: Insufficient documentation

## 2015-04-14 DIAGNOSIS — Z7982 Long term (current) use of aspirin: Secondary | ICD-10-CM | POA: Diagnosis not present

## 2015-04-14 DIAGNOSIS — Z87891 Personal history of nicotine dependence: Secondary | ICD-10-CM | POA: Insufficient documentation

## 2015-04-14 DIAGNOSIS — Z8659 Personal history of other mental and behavioral disorders: Secondary | ICD-10-CM | POA: Insufficient documentation

## 2015-04-14 DIAGNOSIS — Z7951 Long term (current) use of inhaled steroids: Secondary | ICD-10-CM | POA: Insufficient documentation

## 2015-04-14 DIAGNOSIS — R109 Unspecified abdominal pain: Secondary | ICD-10-CM

## 2015-04-14 DIAGNOSIS — N201 Calculus of ureter: Secondary | ICD-10-CM | POA: Diagnosis not present

## 2015-04-14 DIAGNOSIS — K219 Gastro-esophageal reflux disease without esophagitis: Secondary | ICD-10-CM | POA: Diagnosis not present

## 2015-04-14 DIAGNOSIS — Z8669 Personal history of other diseases of the nervous system and sense organs: Secondary | ICD-10-CM | POA: Diagnosis not present

## 2015-04-14 DIAGNOSIS — E039 Hypothyroidism, unspecified: Secondary | ICD-10-CM | POA: Diagnosis not present

## 2015-04-14 DIAGNOSIS — Z880 Allergy status to penicillin: Secondary | ICD-10-CM | POA: Diagnosis not present

## 2015-04-14 DIAGNOSIS — Z79899 Other long term (current) drug therapy: Secondary | ICD-10-CM | POA: Insufficient documentation

## 2015-04-14 LAB — URINALYSIS, ROUTINE W REFLEX MICROSCOPIC
Bilirubin Urine: NEGATIVE
GLUCOSE, UA: NEGATIVE mg/dL
Ketones, ur: NEGATIVE mg/dL
LEUKOCYTES UA: NEGATIVE
Nitrite: NEGATIVE
PH: 6.5 (ref 5.0–8.0)
Protein, ur: NEGATIVE mg/dL
SPECIFIC GRAVITY, URINE: 1.024 (ref 1.005–1.030)
Urobilinogen, UA: 0.2 mg/dL (ref 0.0–1.0)

## 2015-04-14 LAB — COMPREHENSIVE METABOLIC PANEL
ALK PHOS: 84 U/L (ref 38–126)
ALT: 30 U/L (ref 14–54)
ANION GAP: 10 (ref 5–15)
AST: 28 U/L (ref 15–41)
Albumin: 3.8 g/dL (ref 3.5–5.0)
BILIRUBIN TOTAL: 0.9 mg/dL (ref 0.3–1.2)
BUN: 17 mg/dL (ref 6–20)
CALCIUM: 9.2 mg/dL (ref 8.9–10.3)
CO2: 25 mmol/L (ref 22–32)
CREATININE: 0.68 mg/dL (ref 0.44–1.00)
Chloride: 102 mmol/L (ref 101–111)
GFR calc Af Amer: >60 mL/min (ref >60)
GLUCOSE: 157 mg/dL — AB (ref 65–99)
Potassium: 3.9 mmol/L (ref 3.5–5.1)
Sodium: 137 mmol/L (ref 135–145)
TOTAL PROTEIN: 7.6 g/dL (ref 6.5–8.1)

## 2015-04-14 LAB — CBC WITH DIFFERENTIAL/PLATELET
BASOS PCT: 0 %
Basophils Absolute: 0 10*3/uL (ref 0.0–0.1)
EOS ABS: 0.1 10*3/uL (ref 0.0–0.7)
Eosinophils Relative: 1 %
HCT: 42.3 % (ref 36.0–46.0)
Hemoglobin: 14.1 g/dL (ref 12.0–15.0)
LYMPHS ABS: 1.6 10*3/uL (ref 0.7–4.0)
Lymphocytes Relative: 15 %
MCH: 28.3 pg (ref 26.0–34.0)
MCHC: 33.3 g/dL (ref 30.0–36.0)
MCV: 84.9 fL (ref 78.0–100.0)
MONO ABS: 0.3 10*3/uL (ref 0.1–1.0)
MONOS PCT: 3 %
Neutro Abs: 9.2 10*3/uL — ABNORMAL HIGH (ref 1.7–7.7)
Neutrophils Relative %: 81 %
Platelets: 275 10*3/uL (ref 150–400)
RBC: 4.98 MIL/uL (ref 3.87–5.11)
RDW: 14.4 % (ref 11.5–15.5)
WBC: 11.3 10*3/uL — ABNORMAL HIGH (ref 4.0–10.5)

## 2015-04-14 LAB — LIPASE, BLOOD: Lipase: 29 U/L (ref 11–51)

## 2015-04-14 LAB — URINE MICROSCOPIC-ADD ON

## 2015-04-14 MED ORDER — ONDANSETRON 8 MG PO TBDP: 8.0000 mg | ORAL_TABLET | Freq: Three times a day (TID) | ORAL | Status: DC | PRN

## 2015-04-14 MED ORDER — ONDANSETRON HCL 4 MG/2ML IJ SOLN
4.0000 mg | Freq: Once | INTRAMUSCULAR | Status: AC
  Administered 2015-04-14: 4 mg via INTRAVENOUS
  Filled 2015-04-14: qty 2

## 2015-04-14 MED ORDER — KETOROLAC TROMETHAMINE 30 MG/ML IJ SOLN
30.0000 mg | Freq: Once | INTRAMUSCULAR | Status: AC
  Administered 2015-04-14: 30 mg via INTRAVENOUS
  Filled 2015-04-14: qty 1

## 2015-04-14 MED ORDER — TAMSULOSIN HCL 0.4 MG PO CAPS: 0.4000 mg | ORAL_CAPSULE | Freq: Every day | ORAL | Status: DC

## 2015-04-14 MED ORDER — OXYCODONE-ACETAMINOPHEN 5-325 MG PO TABS: 1.0000 | ORAL_TABLET | Freq: Four times a day (QID) | ORAL | Status: DC | PRN

## 2015-04-14 MED ORDER — MORPHINE SULFATE (PF) 4 MG/ML IV SOLN
4.0000 mg | Freq: Once | INTRAVENOUS | Status: AC
  Administered 2015-04-14: 4 mg via INTRAVENOUS
  Filled 2015-04-14: qty 1

## 2015-04-14 MED ORDER — FENTANYL CITRATE (PF) 100 MCG/2ML IJ SOLN
50.0000 ug | Freq: Once | INTRAMUSCULAR | Status: AC
  Administered 2015-04-14: 50 ug via INTRAVENOUS
  Filled 2015-04-14: qty 2

## 2015-04-14 MED ORDER — OXYCODONE-ACETAMINOPHEN 5-325 MG PO TABS
1.0000 | ORAL_TABLET | Freq: Once | ORAL | Status: AC
  Administered 2015-04-14: 1 via ORAL
  Filled 2015-04-14: qty 1

## 2015-04-14 NOTE — ED Notes (Signed)
She c/o sudden onset of left flank pain at about 3am, which persists.

## 2015-04-14 NOTE — ED Notes (Signed)
Nurse drawing labs. 

## 2015-04-14 NOTE — Discharge Instructions (Signed)
Kidney Stones °Kidney stones (urolithiasis) are deposits that form inside your kidneys. The intense pain is caused by the stone moving through the urinary tract. When the stone moves, the ureter goes into spasm around the stone. The stone is usually passed in the urine.  °CAUSES  °· A disorder that makes certain neck glands produce too much parathyroid hormone (primary hyperparathyroidism). °· A buildup of uric acid crystals, similar to gout in your joints. °· Narrowing (stricture) of the ureter. °· A kidney obstruction present at birth (congenital obstruction). °· Previous surgery on the kidney or ureters. °· Numerous kidney infections. °SYMPTOMS  °· Feeling sick to your stomach (nauseous). °· Throwing up (vomiting). °· Blood in the urine (hematuria). °· Pain that usually spreads (radiates) to the groin. °· Frequency or urgency of urination. °DIAGNOSIS  °· Taking a history and physical exam. °· Blood or urine tests. °· CT scan. °· Occasionally, an examination of the inside of the urinary bladder (cystoscopy) is performed. °TREATMENT  °· Observation. °· Increasing your fluid intake. °· Extracorporeal shock wave lithotripsy--This is a noninvasive procedure that uses shock waves to break up kidney stones. °· Surgery may be needed if you have severe pain or persistent obstruction. There are various surgical procedures. Most of the procedures are performed with the use of small instruments. Only small incisions are needed to accommodate these instruments, so recovery time is minimized. °The size, location, and chemical composition are all important variables that will determine the proper choice of action for you. Talk to your health care provider to better understand your situation so that you will minimize the risk of injury to yourself and your kidney.  °HOME CARE INSTRUCTIONS  °· Drink enough water and fluids to keep your urine clear or pale yellow. This will help you to pass the stone or stone fragments. °· Strain  all urine through the provided strainer. Keep all particulate matter and stones for your health care provider to see. The stone causing the pain may be as small as a grain of salt. It is very important to use the strainer each and every time you pass your urine. The collection of your stone will allow your health care provider to analyze it and verify that a stone has actually passed. The stone analysis will often identify what you can do to reduce the incidence of recurrences. °· Only take over-the-counter or prescription medicines for pain, discomfort, or fever as directed by your health care provider. °· Keep all follow-up visits as told by your health care provider. This is important. °· Get follow-up X-rays if required. The absence of pain does not always mean that the stone has passed. It may have only stopped moving. If the urine remains completely obstructed, it can cause loss of kidney function or even complete destruction of the kidney. It is your responsibility to make sure X-rays and follow-ups are completed. Ultrasounds of the kidney can show blockages and the status of the kidney. Ultrasounds are not associated with any radiation and can be performed easily in a matter of minutes. °· Make changes to your daily diet as told by your health care provider. You may be told to: °¨ Limit the amount of salt that you eat. °¨ Eat 5 or more servings of fruits and vegetables each day. °¨ Limit the amount of meat, poultry, fish, and eggs that you eat. °· Collect a 24-hour urine sample as told by your health care provider. You may need to collect another urine sample every 6-12   months. °SEEK MEDICAL CARE IF: °· You experience pain that is progressive and unresponsive to any pain medicine you have been prescribed. °SEEK IMMEDIATE MEDICAL CARE IF:  °· Pain cannot be controlled with the prescribed medicine. °· You have a fever or shaking chills. °· The severity or intensity of pain increases over 18 hours and is not  relieved by pain medicine. °· You develop a new onset of abdominal pain. °· You feel faint or pass out. °· You are unable to urinate. °  °This information is not intended to replace advice given to you by your health care provider. Make sure you discuss any questions you have with your health care provider. °  °Document Released: 05/26/2005 Document Revised: 02/14/2015 Document Reviewed: 10/27/2012 °Elsevier Interactive Patient Education ©2016 [employer] ° °

## 2015-04-14 NOTE — ED Provider Notes (Signed)
CSN: [id number]     Arrival date & time 04/14/15  1100 History   First MD Initiated Contact with Patient 04/14/15 1116     Chief Complaint  Patient presents with  . Flank Pain     (Consider location/radiation/quality/duration/timing/severity/associated sxs/prior Treatment) Patient is a 54 y.o. female presenting with flank pain. The history is provided by the patient.  Flank Pain This is a new problem. Associated symptoms include abdominal pain. Pertinent negatives include no chest pain, no headaches and no shortness of breath.   patient presents with left-sided flank/abdominal pain. Began last night. Is dull. Somewhat better with certain positions. States it felt better when she was laying on her side. She has had the feeling that she needed have a bowel movement. Having a bowel movement does not change the pain. She has had some chills. She's had nausea. No dysuria. States she has not had pains of this before. No dysuria. No rash. No trauma. She states the pain does sometimes go down to her left lower abdomen also.  Past Medical History  Diagnosis Date  . Esophageal stricture   . Anxiety   . Panic disorder   . Sleep apnea   . Hiatal hernia   . GERD (gastroesophageal reflux disease)   . Allergic rhinitis   . Hepatic steatosis   . Fundic gland polyps of stomach, benign   . DM (diabetes mellitus) ([place])   . Hypothyroid    Past Surgical History  Procedure Laterality Date  . Tonsillectomy    . Dilation and curettage of uterus    . Cholecystectomy     Family History  Problem Relation Age of Onset  . Colon cancer Neg Hx   . Diabetes Sister   . Diabetes Mother   . Stomach cancer Brother     drinker  . Diabetes Brother   . Arthritis      through out the family   Social History  Substance Use Topics  . Smoking status: Former Research scientist (life sciences)  . Smokeless tobacco: Never Used  . Alcohol Use: Yes     Comment: occasional   OB History    No data available     Review of Systems   Constitutional: Positive for chills. Negative for appetite change.  HENT: Negative for sore throat.   Respiratory: Negative for shortness of breath.   Cardiovascular: Negative for chest pain.  Gastrointestinal: Positive for nausea and abdominal pain. Negative for vomiting and blood in stool.  Genitourinary: Positive for flank pain.  Musculoskeletal: Negative for back pain.  Skin: Negative for rash and wound.  Neurological: Negative for headaches.  Hematological: Negative for adenopathy.  Psychiatric/Behavioral: Negative for confusion.      Allergies  Celebrex; Meloxicam; Procaine hcl; and Penicillins  Home Medications   Prior to Admission medications   Medication Sig Start Date End Date Taking? Authorizing Provider  aspirin 81 MG tablet Take 81 mg by mouth daily.     Yes Historical Provider, MD  cetirizine (ZYRTEC) 10 MG tablet Take 1 tablet (10 mg total) by mouth daily. 10/08/11  Yes Leandrew Koyanagi, MD  Cholecalciferol (VITAMIN D3) 2000 UNITS capsule Take 2,000 Units by mouth daily.     Yes Historical Provider, MD  fluticasone (FLONASE) 50 MCG/ACT nasal spray Place 2 sprays into the nose every morning. NEED REFILLS 10/08/11  Yes Leandrew Koyanagi, MD  levothyroxine (SYNTHROID, LEVOTHROID) 50 MCG tablet Take 50 mcg by mouth daily. 04/09/15  Yes Historical Provider, MD  metFORMIN (GLUCOPHAGE-XR) 500 MG 24  hr tablet Take 2,000 mg by mouth every evening.  12/14/13  Yes Historical Provider, MD  montelukast (SINGULAIR) 10 MG tablet Take one tablet by mouth daily.   Yes Leandrew Koyanagi, MD  omeprazole (PRILOSEC OTC) 20 MG tablet Take 1 tablet (20 mg total) by mouth 2 (two) times daily. Patient taking differently: Take 20 mg by mouth daily.  01/10/15  Yes Lafayette Dragon, MD  PROAIR HFA 108 [phone] BASE) MCG/ACT inhaler INHALE 2 PUFFS 4 TIMES A DAY AS NEEDED...NEEDS OFFICE VISIT FOR MORE Patient taking differently: INHALE 2 PUFFS 4 TIMES A DAY AS NEEDED SOB 12/11/12  Yes Ryan M Dunn, PA-C   sucralfate (CARAFATE) 1 GM/10ML suspension Take 10 mLs (1 g total) by mouth 2 (two) times daily. Patient taking differently: Take 1 g by mouth 2 (two) times daily as needed (to help digestion or eat something greasy).  01/10/15  Yes Lafayette Dragon, MD  ondansetron (ZOFRAN-ODT) 8 MG disintegrating tablet Take 1 tablet (8 mg total) by mouth every 8 (eight) hours as needed for nausea or vomiting. 04/14/15   Davonna Belling, MD  oxyCODONE-acetaminophen (PERCOCET/ROXICET) 5-325 MG tablet Take 1-2 tablets by mouth every 6 (six) hours as needed for severe pain. 04/14/15   Davonna Belling, MD  Probiotic Product (ALIGN) 4 MG CAPS Take 1 capsule by mouth daily. Patient not taking: Reported on 04/14/2015 10/13/11   Lafayette Dragon, MD  ranitidine (ZANTAC) 300 MG capsule Take 1 capsule (300 mg total) by mouth as needed for heartburn. Patient not taking: Reported on 04/14/2015 01/10/15   Lafayette Dragon, MD  tamsulosin (FLOMAX) 0.4 MG CAPS capsule Take 1 capsule (0.4 mg total) by mouth daily. 04/14/15   Davonna Belling, MD   BP 115/79 mmHg  Pulse 77  Temp(Src) 97.6 F (36.4 C) (Oral)  Resp 18  SpO2 98% Physical Exam  Constitutional: She appears well-developed.  HENT:  Head: Atraumatic.  Neck: Neck supple.  Cardiovascular: Normal rate and regular rhythm.   Pulmonary/Chest: Effort normal.  Abdominal: There is tenderness.  Mild left CVA and left upper quadrant tenderness. No rebound or guarding. No rash.  Musculoskeletal: Normal range of motion.  Neurological: She is alert.  Skin: Skin is warm.    ED Course  Procedures (including critical care time) Labs Review Labs Reviewed  URINALYSIS, ROUTINE W REFLEX MICROSCOPIC (NOT AT [facility]) - Abnormal; Notable for the following:    APPearance CLOUDY (*)    Hgb urine dipstick LARGE (*)    All other components within normal limits  COMPREHENSIVE METABOLIC PANEL - Abnormal; Notable for the following:    Glucose, Bld 157 (*)    All other components within normal  limits  CBC WITH DIFFERENTIAL/PLATELET - Abnormal; Notable for the following:    WBC 11.3 (*)    Neutro Abs 9.2 (*)    All other components within normal limits  LIPASE, BLOOD  URINE MICROSCOPIC-ADD ON    Imaging Review Ct Renal Stone Study  04/14/2015  CLINICAL DATA:  Sudden onset left flank pain at 3 a.m. today. EXAM: CT ABDOMEN AND PELVIS WITHOUT CONTRAST TECHNIQUE: Multidetector CT imaging of the abdomen and pelvis was performed following the standard protocol without IV contrast. COMPARISON:  Abdomen ultrasound dated 11/25/2013. FINDINGS: Lower chest: Minimal bullous change at the right lung base. 4 mm nodule in the left lower lobe on image 13. Hepatobiliary: Diffuse low density of the liver relative to the spleen. Cholecystectomy clips. Pancreas: No mass or inflammatory process identified on this un-enhanced  exam. Spleen: Within normal limits in size. Adrenals/Urinary Tract: 7 mm calculus at the left ureteropelvic junction with associated mild to moderate dilatation of the left renal collecting system, diffuse enlargement of the left kidney and mild left perinephric soft tissue stranding. No ureteral, bladder or right renal calculi are seen. Stomach/Bowel: Moderately large hiatal hernia with a minimal paraesophageal component. No gastric, small bowel or colonic dilatation. No evidence of appendicitis. Vascular/Lymphatic: No pathologically enlarged lymph nodes. No evidence of abdominal aortic aneurysm. Reproductive: No mass or other significant abnormality. Other: Small left inguinal hernia containing fat. Musculoskeletal: Mild bilateral hip degenerative changes. Lower thoracic spine degenerative changes. Mild lumbar spine degenerative changes. IMPRESSION: 1. 7 mm left UPJ calculus causing mild to moderate left hydronephrosis. 2. Moderately large hiatal hernia. 3. Small left inguinal hernia containing fat. 4. 4 mm left lower lobe nodule. If the patient is at high risk for bronchogenic carcinoma,  follow-up chest CT at 1 year is recommended. If the patient is at low risk, no follow-up is needed. This recommendation follows the consensus statement: Guidelines for Management of Small Pulmonary Nodules Detected on CT Scans: A Statement from the [place] as published in Radiology 2005; 237:395-400. Electronically Signed   By: Claudie Revering M.D.   On: 04/14/2015 13:25   I have personally reviewed and evaluated these images and lab results as part of my medical decision-making.   EKG Interpretation None      MDM   Final diagnoses:  Left flank pain  Left ureteral stone    Patient with flank pain. Found to have large proximal UPJ stone. No infection. Pain improved. Will discharge to follow-up with urology.    Davonna Belling, MD 04/15/15 [phone]

## 2015-04-17 ENCOUNTER — Other Ambulatory Visit: Payer: Self-pay | Admitting: Urology

## 2015-04-18 ENCOUNTER — Encounter (HOSPITAL_COMMUNITY): Payer: Self-pay | Admitting: General Practice

## 2015-04-19 ENCOUNTER — Encounter (HOSPITAL_COMMUNITY)
Admission: RE | Disposition: A | Discharge status: Home / Self Care | Payer: Self-pay | Source: Ambulatory Visit | Attending: Urology

## 2015-04-19 ENCOUNTER — Encounter (HOSPITAL_COMMUNITY): Payer: Self-pay | Admitting: General Practice

## 2015-04-19 ENCOUNTER — Ambulatory Visit (HOSPITAL_COMMUNITY): Payer: BC Managed Care – PPO

## 2015-04-19 ENCOUNTER — Ambulatory Visit (HOSPITAL_COMMUNITY)
Admission: RE | Admit: 2015-04-19 | Discharge: 2015-04-19 | Disposition: A | Discharge status: Home / Self Care | Payer: BC Managed Care – PPO | Source: Ambulatory Visit | Attending: Urology | Admitting: Urology

## 2015-04-19 DIAGNOSIS — M1990 Unspecified osteoarthritis, unspecified site: Secondary | ICD-10-CM | POA: Diagnosis not present

## 2015-04-19 DIAGNOSIS — Z7982 Long term (current) use of aspirin: Secondary | ICD-10-CM | POA: Insufficient documentation

## 2015-04-19 DIAGNOSIS — Z7951 Long term (current) use of inhaled steroids: Secondary | ICD-10-CM | POA: Diagnosis not present

## 2015-04-19 DIAGNOSIS — Z7984 Long term (current) use of oral hypoglycemic drugs: Secondary | ICD-10-CM | POA: Insufficient documentation

## 2015-04-19 DIAGNOSIS — E039 Hypothyroidism, unspecified: Secondary | ICD-10-CM | POA: Insufficient documentation

## 2015-04-19 DIAGNOSIS — E669 Obesity, unspecified: Secondary | ICD-10-CM | POA: Insufficient documentation

## 2015-04-19 DIAGNOSIS — G4730 Sleep apnea, unspecified: Secondary | ICD-10-CM | POA: Insufficient documentation

## 2015-04-19 DIAGNOSIS — E119 Type 2 diabetes mellitus without complications: Secondary | ICD-10-CM | POA: Diagnosis not present

## 2015-04-19 DIAGNOSIS — K449 Diaphragmatic hernia without obstruction or gangrene: Secondary | ICD-10-CM | POA: Diagnosis not present

## 2015-04-19 DIAGNOSIS — Z79891 Long term (current) use of opiate analgesic: Secondary | ICD-10-CM | POA: Insufficient documentation

## 2015-04-19 DIAGNOSIS — N201 Calculus of ureter: Secondary | ICD-10-CM

## 2015-04-19 DIAGNOSIS — J45909 Unspecified asthma, uncomplicated: Secondary | ICD-10-CM | POA: Diagnosis not present

## 2015-04-19 DIAGNOSIS — E7800 Pure hypercholesterolemia, unspecified: Secondary | ICD-10-CM | POA: Insufficient documentation

## 2015-04-19 DIAGNOSIS — Z79899 Other long term (current) drug therapy: Secondary | ICD-10-CM | POA: Diagnosis not present

## 2015-04-19 DIAGNOSIS — K219 Gastro-esophageal reflux disease without esophagitis: Secondary | ICD-10-CM | POA: Diagnosis not present

## 2015-04-19 DIAGNOSIS — Z6841 Body Mass Index (BMI) 40.0 and over, adult: Secondary | ICD-10-CM | POA: Insufficient documentation

## 2015-04-19 HISTORY — DX: Calculus of kidney: N20.0

## 2015-04-19 LAB — GLUCOSE, CAPILLARY: Glucose-Capillary: 162 mg/dL — ABNORMAL HIGH (ref 65–99)

## 2015-04-19 SURGERY — LITHOTRIPSY, ESWL
Anesthesia: LOCAL | Laterality: Left

## 2015-04-19 MED ORDER — SODIUM CHLORIDE 0.9 % IV SOLN
INTRAVENOUS | Status: DC
  Administered 2015-04-19: 08:00:00 via INTRAVENOUS

## 2015-04-19 MED ORDER — DIAZEPAM 5 MG PO TABS
10.0000 mg | ORAL_TABLET | ORAL | Status: AC
  Administered 2015-04-19: 10 mg via ORAL
  Filled 2015-04-19: qty 2

## 2015-04-19 MED ORDER — DIPHENHYDRAMINE HCL 25 MG PO CAPS
25.0000 mg | ORAL_CAPSULE | ORAL | Status: AC
  Administered 2015-04-19: 25 mg via ORAL
  Filled 2015-04-19: qty 1

## 2015-04-19 MED ORDER — OXYCODONE-ACETAMINOPHEN 5-325 MG PO TABS
1.0000 | ORAL_TABLET | ORAL | Status: DC
  Filled 2015-04-19 (×2): qty 1

## 2015-04-19 MED ORDER — OXYCODONE-ACETAMINOPHEN 5-325 MG PO TABS
1.0000 | ORAL_TABLET | ORAL | Status: AC
  Administered 2015-04-19: 1 via ORAL

## 2015-04-19 NOTE — Interval H&P Note (Signed)
History and Physical Interval Note:  04/19/2015 9:09 AM  Wendy Curtis  has presented today for surgery, with the diagnosis of LEFT URETERAL STONE  The various methods of treatment have been discussed with the patient and family. After consideration of risks, benefits and other options for treatment, the patient has consented to  Procedure(s): LEFT EXTRACORPOREAL SHOCK WAVE LITHOTRIPSY (ESWL) (Left) as a surgical intervention .  The patient's history has been reviewed, patient examined, no change in status, stable for surgery.  I have reviewed the patient's chart and labs.  Questions were answered to the patient's satisfaction.     Louis Meckel W

## 2015-04-19 NOTE — Discharge Instructions (Signed)
See [facility] discharge instructions in chart.  

## 2015-04-19 NOTE — Op Note (Signed)
See [facility] OP note scanned into chart. Also because of the size, density, location and other factors that cannot be anticipated I feel this will likely be a staged procedure. This fact supersedes any indication in the scanned [facility] operative note to the contrary.  

## 2015-04-19 NOTE — H&P (Signed)
Reason For Visit left UPJ stone   History of Present Illness 20F presents today for follow-up from the ED where she was seen for a 72mm left UPJ stone. The patient states that since she was seen in the emergency department approximately 4 days prior her pain has improved considerably. She continues to have some pressure in the left flank region. She denies any hematuria or dysuria. She denies any change to her voiding symptoms including increased frequency, urgency, or incontinence. Her pain is manageable without the use of medication. The patient tells me that she passed a stone at the age of 79, has not had any issues with nephrolithiasis since that time.  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   Past Medical History Problems  1. History of Anxiety (F41.9) 2. History of Esophageal stricture (K22.2) 3. History of Hepatic steatosis (K76.0) 4. History of arthritis (Z87.39) 5. History of asthma (Z87.09) 6. History of diabetes mellitus (Z86.39) 7. History of esophageal reflux (Z87.19) 8. History of hiatal hernia (Z87.19) 9. History of hypercholesterolemia (Z86.39) 10. History of hypothyroidism (Z86.39) 11. History of sleep apnea (Z87.09) 12. History of Panic disorder (F41.0)  Surgical History Problems  1. History of Cholecystectomy 2. History of Dilation And Curettage 3. History of Tonsillectomy  Current Meds 1. Align 4 MG Oral Capsule;  Therapy: (Recorded:08Nov2016) to Recorded 2. Aspirin 81 MG TABS;  Therapy: (Recorded:08Nov2016) to Recorded 3. Cetirizine HCl - 10 MG Oral Tablet;  Therapy: (Recorded:08Nov2016) to Recorded 4. Fluticasone Propionate 50 MCG/ACT Nasal Suspension;  Therapy: (Recorded:08Nov2016) to Recorded 5. Levothyroxine Sodium 50 MCG Oral Tablet;  Therapy: (Recorded:08Nov2016) to Recorded 6. MetFORMIN HCl ER 500 MG Oral Tablet Extended Release 24 Hour;  take 2,000mg  every  evening;  Therapy: (Recorded:08Nov2016) to Recorded 7. Omeprazole 20 MG Oral Capsule Delayed Release;  Therapy: (Recorded:08Nov2016) to Recorded 8. Ondansetron 8 MG Oral Tablet Dispersible;  Therapy: (Recorded:08Nov2016) to Recorded 9. Oxycodone-Acetaminophen 5-325 MG Oral Tablet;  Therapy: (Recorded:08Nov2016) to Recorded 10. ProAir HFA 108 (90 Base) MCG/ACT Inhalation Aerosol Solution;   Therapy: (Recorded:08Nov2016) to Recorded 11. Singulair 10 MG Oral Tablet;   Therapy: (Recorded:08Nov2016) to Recorded 12. Sucralfate 1 GM/10ML Oral Suspension;   Therapy: (Recorded:08Nov2016) to Recorded 13. Tamsulosin HCl - 0.4 MG Oral Capsule;   Therapy: (Recorded:08Nov2016) to Recorded 14. Vitamin D3 2000 UNIT Oral Capsule;   Therapy: (Recorded:08Nov2016) to Recorded  Allergies Medication  1. Penicillins 2. CeleBREX CAPS 3. meloxicam 4. Novocain SOLN  Family History Problems  1. Family history of arthritis (Z82.61) 2. Family history of diabetes mellitus (Z83.3) : Mother, Sister, Brother 3. Family history of malignant neoplasm of stomach (Z80.0) : Brother 4. Family history of sleep apnea (Z82.0) : Mother  Social History Problems    Alcohol use (Z78.9)   .25   Caffeine use (F15.90)   1   Father deceased   Former smoker (Z74.891)   Married   Mother deceased   Occupation   Pharmacist, hospital  Review of Systems Genitourinary, constitutional, skin, eye, otolaryngeal, hematologic/lymphatic, cardiovascular, pulmonary, endocrine, musculoskeletal, gastrointestinal, neurological and psychiatric system(s) were reviewed and pertinent findings if present are noted and are otherwise negative.  Genitourinary: nocturia, incontinence, urinary hesitancy and hematuria.  Gastrointestinal: nausea.  Musculoskeletal: back pain and joint pain.    Vitals Vital Signs [Data Includes: Last 1 Day]  Recorded: [id number] 09:12AM  Height: 4 ft 10 in Weight: 199 lb 6.4 oz BMI  Calculated:  41.67 BSA Calculated: 1.82 Blood Pressure: 126 / 75 Temperature: 97.3 F Heart Rate: 77  Physical Exam Constitutional: Well nourished and well developed . No acute distress.  ENT:. The ears and nose are normal in appearance.  Neck: The appearance of the neck is normal and no neck mass is present.  Pulmonary: No respiratory distress and normal respiratory rhythm and effort.  Cardiovascular: Heart rate and rhythm are normal . No peripheral edema.  Abdomen: The abdomen is soft and nontender. No masses are palpated. No right CVA tenderness and left CVA tenderness. No hernias are palpable. No hepatosplenomegaly noted.  Lymphatics: The posterior cervical and anterior cervical nodes are not enlarged or tender.  Skin: Normal skin turgor, no visible rash and no visible skin lesions.  Neuro/Psych:. Mood and affect are appropriate.    Results/Data Urine [Data Includes: Last 1 Day]   [id number]  COLOR YELLOW   APPEARANCE CLEAR   SPECIFIC GRAVITY 1.020   pH 5.5   GLUCOSE NEGATIVE   BILIRUBIN NEGATIVE   KETONE NEGATIVE   BLOOD TRACE   PROTEIN NEGATIVE   NITRITE NEGATIVE   LEUKOCYTE ESTERASE NEGATIVE   SQUAMOUS EPITHELIAL/HPF 0-5 HPF  WBC NONE SEEN WBC/HPF  RBC 3-10 RBC/HPF  BACTERIA NONE SEEN HPF  CRYSTALS NONE SEEN HPF  CASTS NONE SEEN LPF  Yeast NONE SEEN HPF   UA demonstrates RBC without evidence of infection  KUB: Renal shadows are visible bilaterally. There is a calcification around L2/L3 consistent with the patient's known stone. This clearly visible on KUB. There are no additional calcifications along the expected trajectory of either ureter. Gas pattern is grossly normal. There are no bony abnormalities.   Assessment Assessed  1. Left ureteral calculus (N20.1)  The patient has a left proximal ureteral stone with minimal symptoms at this point.   Plan Health Maintenance  1. UA With REFLEX; [Do Not Release]; Status:Complete;   [id number] 09:02AM Left  ureteral calculus  2. Follow-up PRN Office  Follow-up  Status: Complete  Done: [id number] 3. Follow-up Schedule Surgery Office  Follow-up  Status: Complete  Done: [id number] 4. KUB; Status:Resulted - Requires Verification;   [id number] 10:28AM  Discussion/Summary We discussed management options including medical expulsion therapy, shockwave lithotripsy, and ureteroscopy. Ultimately, the patient has opted for shock wave lithotripsy. I discussed with the patient the procedure in detail as well as the risk and benefits. The patient is aware that she may need additional procedures. She also is aware of the risks of hematoma and pain. We will try to get this patient's scheduled as soon as possible.

## 2015-07-31 ENCOUNTER — Telehealth: Payer: Self-pay | Admitting: Gastroenterology

## 2015-07-31 NOTE — Telephone Encounter (Signed)
L/M for patient that she needs to schedule an office appointment with one of our providers here in the office to establish care. Not sure why omeprazole 20 mg would not be covered under insurance. Its the lowest priced PPI on the market. Its $4 at [facility] . Patient needs to schedule an appointment, and when she does I will send new script to [facility]

## 2015-09-17 ENCOUNTER — Encounter: Payer: Self-pay | Admitting: Obstetrics and Gynecology

## 2015-09-17 ENCOUNTER — Other Ambulatory Visit: Payer: Self-pay

## 2015-09-17 ENCOUNTER — Ambulatory Visit (INDEPENDENT_AMBULATORY_CARE_PROVIDER_SITE_OTHER): Payer: BC Managed Care – PPO | Admitting: Obstetrics and Gynecology

## 2015-09-17 ENCOUNTER — Telehealth: Payer: Self-pay | Admitting: Gastroenterology

## 2015-09-17 VITALS — BP 112/74 | HR 70 | Resp 16 | Ht <= 58 in | Wt 202.0 lb

## 2015-09-17 DIAGNOSIS — Z0000 Encounter for general adult medical examination without abnormal findings: Secondary | ICD-10-CM | POA: Diagnosis not present

## 2015-09-17 DIAGNOSIS — Z01419 Encounter for gynecological examination (general) (routine) without abnormal findings: Secondary | ICD-10-CM | POA: Diagnosis not present

## 2015-09-17 DIAGNOSIS — Z124 Encounter for screening for malignant neoplasm of cervix: Secondary | ICD-10-CM | POA: Diagnosis not present

## 2015-09-17 DIAGNOSIS — Z1231 Encounter for screening mammogram for malignant neoplasm of breast: Secondary | ICD-10-CM

## 2015-09-17 LAB — POCT URINALYSIS DIPSTICK
Bilirubin, UA: NEGATIVE
Glucose, UA: NEGATIVE
Ketones, UA: NEGATIVE
Leukocytes, UA: NEGATIVE
Nitrite, UA: NEGATIVE
PH UA: 5
PROTEIN UA: NEGATIVE
RBC UA: NEGATIVE
UROBILINOGEN UA: NEGATIVE

## 2015-09-17 NOTE — Patient Instructions (Signed)

## 2015-09-17 NOTE — Progress Notes (Signed)
55 y.o. G25P0020 Married indianF here for annual exam.  Postmenopausal, no bleeding. Mood has improved, was bad for a while when she was going through menopause.  She was put on HRT in 2014, had some bleeding, went off HRT, no further bleeding. She felt calmer on the HRT. She is able to control her symptoms more now. She is not having any vasomotor symptoms, mood is now okay. Intermittently having trouble having joy, mostly can pull herself out.  Not sexually active. Husband is 69, prostate issues.  She has had some autoimmune issues in her mouth, urticaria, arthritis. She is going to see an immunologist.     Patient's last menstrual period was 06/09/2012.          Sexually active: No.  The current method of family planning is vasectomy.    Exercising: No.  exercise Smoker:  no  Health Maintenance: Pap:  2014 History of abnormal Pap:  no MMG:  11-25-13 category b density, birads 1:neg Colonoscopy:  Had done, at 64, due at 58 BMD:   2011 TDaP: 2003 Gardasil: none Self breast exam: done occ   reports that she has quit smoking. She has never used smokeless tobacco. She reports that she drinks alcohol. 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  Past Medical History  Diagnosis Date  . Esophageal stricture   . Anxiety   . Panic disorder   . Hiatal hernia   . GERD (gastroesophageal reflux disease)   . Allergic rhinitis   . Hepatic steatosis   . Fundic gland polyps of stomach, benign   . DM (diabetes mellitus) ([place])   . Hypothyroid   . Kidney stones   . Sleep apnea     test negative, does not use anything  . Anemia     Past Surgical History  Procedure Laterality Date  . Tonsillectomy    . Dilation and curettage of uterus    . Cholecystectomy    . Sinsus surgery  1996-1997    sinsus drainage     Current Outpatient Prescriptions  Medication Sig Dispense Refill  . aspirin 81 MG tablet Take 81 mg by mouth daily.      .  cetirizine (ZYRTEC) 10 MG tablet Take 1 tablet (10 mg total) by mouth daily. 90 tablet 3  . Cholecalciferol (VITAMIN D3) 2000 UNITS capsule Take 2,000 Units by mouth daily.      . fluticasone (FLONASE) 50 MCG/ACT nasal spray Place 2 sprays into the nose every morning. NEED REFILLS    . IRON PO Take by mouth as needed.    Marland Kitchen levothyroxine (SYNTHROID, LEVOTHROID) 75 MCG tablet Take 75 mcg by mouth daily.  1  . metFORMIN (GLUCOPHAGE-XR) 500 MG 24 hr tablet Take 2,000 mg by mouth every evening.     . montelukast (SINGULAIR) 10 MG tablet Take one tablet by mouth daily. 30 tablet 2  . omeprazole (PRILOSEC OTC) 20 MG tablet Take 1 tablet (20 mg total) by mouth 2 (two) times daily. (Patient taking differently: Take 20 mg by mouth daily. ) 180 tablet 3  . PROAIR HFA 108 (90 BASE) MCG/ACT inhaler INHALE 2 PUFFS 4 TIMES A DAY AS NEEDED...NEEDS OFFICE VISIT FOR MORE (Patient taking differently: INHALE 2 PUFFS 4 TIMES A DAY AS NEEDED SOB) 8.5 each 1  . Probiotic Product (ALIGN) 4 MG CAPS Take 1 capsule by mouth daily. 21 capsule 0   No current facility-administered medications for this visit.  Family History  Problem Relation Age of Onset  . Colon cancer Neg Hx   . Diabetes Sister   . Diabetes Mother   . Stomach cancer Brother     drinker  . Diabetes Brother   . Arthritis      through out the family  . Diabetes Sister     Review of Systems  Constitutional: Negative.   HENT: Negative.   Eyes: Negative.   Respiratory: Negative.   Cardiovascular: Negative.   Gastrointestinal: Negative.   Endocrine: Negative.   Genitourinary: Negative.   Musculoskeletal: Negative.   Skin: Negative.   Allergic/Immunologic: Negative.   Neurological: Negative.   Hematological: Negative.   Psychiatric/Behavioral: Negative.   Loose stools from the metformin.   Exam:   BP 112/74 mmHg  Pulse 70  Resp 16  Ht 4\' 10"  (1.473 m)  Wt 202 lb (91.627 kg)  BMI 42.23 kg/m2  LMP 06/09/2012  Weight change:  @WEIGHTCHANGE @ Height:   Height: 4\' 10"  (147.3 cm)  Ht Readings from Last 3 Encounters:  09/17/15 4\' 10"  (1.473 m)  04/19/15 4\' 10"  (1.473 m)  01/10/15 4\' 10"  (1.473 m)    General appearance: alert, cooperative and appears stated age Head: Normocephalic, without obvious abnormality, atraumatic Neck: no adenopathy, supple, symmetrical, trachea midline and thyroid normal to inspection and palpation Lungs: clear to auscultation bilaterally Breasts: normal appearance, no masses or tenderness Heart: regular rate and rhythm Abdomen: soft, non-tender; bowel sounds normal; no masses,  no organomegaly Extremities: extremities normal, atraumatic, no cyanosis or edema Skin: Skin color, texture, turgor normal. No rashes or lesions Lymph nodes: Cervical, supraclavicular, and axillary nodes normal. No abnormal inguinal nodes palpated Neurologic: Grossly normal   Pelvic: External genitalia:  no lesions              Urethra:  normal appearing urethra with no masses, tenderness or lesions              Bartholins and Skenes: normal                 Vagina: normal appearing vagina with normal color and discharge, no lesions              Cervix: no lesions               Bimanual Exam:  Uterus:  normal size, contour, position, consistency, mobility, non-tender              Adnexa: no mass, fullness, tenderness               Rectovaginal: Confirms               Anus:  normal sphincter tone, no lesions  Chaperone was present for exam.  A:  Well Woman with normal exam  Dysthymia, mood changes. Discussed exercise, therapy, medication. She will call if she wants medication  P:   Pap with hpv  Mammogram tomorrow  Colonoscopy UTD  Discussed breast self exam  Discussed calcium and vit D intake  Labs and immunizations with her primary (will get her TDAP)  Therapist name given

## 2015-09-17 NOTE — Telephone Encounter (Signed)
Have not received a prior authorization form for this. Will check into it

## 2015-09-18 ENCOUNTER — Ambulatory Visit
Admission: RE | Admit: 2015-09-18 | Discharge: 2015-09-18 | Disposition: A | Discharge status: Home / Self Care | Payer: BC Managed Care – PPO | Source: Ambulatory Visit

## 2015-09-18 DIAGNOSIS — Z1231 Encounter for screening mammogram for malignant neoplasm of breast: Secondary | ICD-10-CM

## 2015-09-18 MED ORDER — OMEPRAZOLE MAGNESIUM 20 MG PO TBEC: 20.0000 mg | DELAYED_RELEASE_TABLET | Freq: Two times a day (BID) | ORAL | Status: DC

## 2015-09-18 NOTE — Telephone Encounter (Signed)
Looks like this prescription has not been sent in new since Aug 2016. Sent in new script for prilosec. I suspect that insurance will cover this med because it is the lowest teer medication (PPI)

## 2015-09-19 ENCOUNTER — Ambulatory Visit: Payer: BC Managed Care – PPO | Admitting: Gastroenterology

## 2015-09-19 LAB — IPS PAP TEST WITH HPV

## 2015-10-22 ENCOUNTER — Other Ambulatory Visit (INDEPENDENT_AMBULATORY_CARE_PROVIDER_SITE_OTHER): Payer: BC Managed Care – PPO

## 2015-10-22 ENCOUNTER — Encounter: Payer: Self-pay | Admitting: Gastroenterology

## 2015-10-22 ENCOUNTER — Ambulatory Visit (INDEPENDENT_AMBULATORY_CARE_PROVIDER_SITE_OTHER): Payer: BC Managed Care – PPO | Admitting: Gastroenterology

## 2015-10-22 VITALS — BP 108/76 | HR 88 | Temp 98.7°F | Ht <= 58 in | Wt 202.4 lb

## 2015-10-22 DIAGNOSIS — K1230 Oral mucositis (ulcerative), unspecified: Secondary | ICD-10-CM

## 2015-10-22 DIAGNOSIS — K219 Gastro-esophageal reflux disease without esophagitis: Secondary | ICD-10-CM

## 2015-10-22 LAB — CBC WITH DIFFERENTIAL/PLATELET
Basophils Absolute: 0 10*3/uL (ref 0.0–0.1)
Basophils Relative: 0.4 % (ref 0.0–3.0)
EOS PCT: 2.8 % (ref 0.0–5.0)
Eosinophils Absolute: 0.3 10*3/uL (ref 0.0–0.7)
HCT: 41.3 % (ref 36.0–46.0)
Hemoglobin: 13.9 g/dL (ref 12.0–15.0)
LYMPHS ABS: 3 10*3/uL (ref 0.7–4.0)
Lymphocytes Relative: 31.7 % (ref 12.0–46.0)
MCHC: 33.7 g/dL (ref 30.0–36.0)
MCV: 83.4 fl (ref 78.0–100.0)
MONO ABS: 0.5 10*3/uL (ref 0.1–1.0)
MONOS PCT: 4.9 % (ref 3.0–12.0)
NEUTROS ABS: 5.7 10*3/uL (ref 1.4–7.7)
NEUTROS PCT: 60.2 % (ref 43.0–77.0)
PLATELETS: 306 10*3/uL (ref 150.0–400.0)
RBC: 4.95 Mil/uL (ref 3.87–5.11)
RDW: 15.1 % (ref 11.5–15.5)
WBC: 9.4 10*3/uL (ref 4.0–10.5)

## 2015-10-22 LAB — BASIC METABOLIC PANEL
BUN: 15 mg/dL (ref 6–23)
CALCIUM: 9.9 mg/dL (ref 8.4–10.5)
CO2: 29 mEq/L (ref 19–32)
CREATININE: 0.55 mg/dL (ref 0.40–1.20)
Chloride: 100 mEq/L (ref 96–112)
GFR: 120.99 mL/min (ref >60.00)
Glucose, Bld: 145 mg/dL — ABNORMAL HIGH (ref 70–99)
Potassium: 4.2 mEq/L (ref 3.5–5.1)
Sodium: 137 mEq/L (ref 135–145)

## 2015-10-22 LAB — TSH: TSH: 4.59 u[IU]/mL — AB (ref 0.35–4.50)

## 2015-10-22 LAB — VITAMIN B12: Vitamin B-12: 359 pg/mL (ref 211–911)

## 2015-10-22 LAB — FOLATE: Folate: >23.3 ng/mL (ref >5.9)

## 2015-10-22 LAB — FERRITIN: FERRITIN: 22.9 ng/mL (ref 10.0–291.0)

## 2015-10-22 MED ORDER — ESOMEPRAZOLE MAGNESIUM 40 MG PO CPDR: 40.0000 mg | DELAYED_RELEASE_CAPSULE | Freq: Every day | ORAL | Status: DC

## 2015-10-22 NOTE — Patient Instructions (Signed)
Go to the basement for labs today Follow up in 3 months  

## 2015-10-23 ENCOUNTER — Telehealth: Payer: Self-pay | Admitting: Obstetrics and Gynecology

## 2015-10-23 NOTE — Telephone Encounter (Signed)
Spoke with patient. Advised of message as seen below from [place]. Patient states that she would like to come in to the office to discuss this with Dr.Jertson. Patient is unable to schedule an appointment at this time, but will return call to the office to schedule an appointment tomorrow.  Routing to provider for final review. Patient agreeable to disposition. Will close encounter.

## 2015-10-23 NOTE — Progress Notes (Signed)
Wendy Curtis    EC:[id number]    Nov 17, 1960  Primary Care Physician:KOIRALA,DIBAS, MD  Referring Physician: [name], MD Wendy Curtis, Wendy Curtis [id number]  Chief complaint:  GERD  HPI: 55 year old female previously followed by Wendy Curtis with chronic gastroesophageal reflux here to establish care with me. Last EGD in July 2015  showed mild esophageal stricture and 5 cm hiatal hernia extending from 30-35 cm from the incisors. Savary dilator 15 and 16 mm passed without difficulty. There is no evidence of Barrett's esophagus. Her brother died of gastric cancer. She is up-to-date on colonoscopy which was done in June 2011. She is post cholecystectomy. Patient is here to discuss her concerns regarding long term PPI therapy. She is on Prilosec 20 mg twice a day. . Her symptoms are well controlled on the current regimen but she has daily breakthrough symptoms if she stops taking Omeprazole.. She denies recurrent dysphagia, but c/o burning sensation in the tongue and oral mucosa. She saw dentist, and had dental clean up and also was prescribed antifungal for possible yeast infection for 2 weeks with no significant improvement. She also c/o furrows in her tongue, associated with swelling. She has hypothyroidism and her dose was increased to 20mcg daily few months ago and feels its related to the medication as her burning sensation in the mouth started around the same time. Denies any nausea, vomiting, abdominal pain, melena or bright red blood per rectum   Outpatient Encounter Prescriptions as of 10/22/2015  Medication Sig  . aspirin 81 MG tablet Take 81 mg by mouth daily.    . cetirizine (ZYRTEC) 10 MG tablet Take 1 tablet (10 mg total) by mouth daily.  . Cholecalciferol (VITAMIN D3) 2000 UNITS capsule Take 2,000 Units by mouth daily.    . fluticasone (FLONASE) 50 MCG/ACT nasal spray Place 2 sprays into the nose every morning. NEED REFILLS  . IRON PO Take by  mouth as needed.  Marland Kitchen levothyroxine (SYNTHROID, LEVOTHROID) 75 MCG tablet Take 75 mcg by mouth daily.  . metFORMIN (GLUCOPHAGE-XR) 500 MG 24 hr tablet Take 2,000 mg by mouth every evening.   . montelukast (SINGULAIR) 10 MG tablet Take one tablet by mouth daily.  Marland Kitchen PROAIR HFA 108 (90 BASE) MCG/ACT inhaler INHALE 2 PUFFS 4 TIMES A DAY AS NEEDED...NEEDS OFFICE VISIT FOR MORE (Patient taking differently: INHALE 2 PUFFS 4 TIMES A DAY AS NEEDED SOB)  . Probiotic Product (ALIGN) 4 MG CAPS Take 1 capsule by mouth daily.  . [DISCONTINUED] omeprazole (PRILOSEC OTC) 20 MG tablet Take 1 tablet (20 mg total) by mouth 2 (two) times daily.  Marland Kitchen esomeprazole (NEXIUM) 40 MG capsule Take 1 capsule (40 mg total) by mouth daily at 12 noon.   No facility-administered encounter medications on file as of 10/22/2015.    Allergies as of 10/22/2015 - Review Complete 10/22/2015  Allergen Reaction Noted  . Celebrex [celecoxib]  09/12/2011  . Meloxicam  09/12/2011  . Procaine hcl Other (See Comments) 08/27/2009  . Penicillins Rash 08/27/2009    Past Medical History  Diagnosis Date  . Esophageal stricture   . Anxiety   . Panic disorder   . Hiatal hernia   . GERD (gastroesophageal reflux disease)   . Allergic rhinitis   . Hepatic steatosis   . Fundic gland polyps of stomach, benign   . DM (diabetes mellitus) ([place])   . Hypothyroid   . Kidney stones   . Sleep apnea  test negative, does not use anything  . Anemia     Past Surgical History  Procedure Laterality Date  . Tonsillectomy    . Dilation and curettage of uterus    . Cholecystectomy    . Sinsus surgery  [phone]    sinsus drainage     Family History  Problem Relation Age of Onset  . Colon cancer Neg Hx   . Diabetes Sister   . Diabetes Mother   . Stomach cancer Brother     drinker  . Diabetes Brother   . Arthritis      through out the family  . Diabetes Sister     Social History   Social History  . Marital Status: Married    Spouse  Name: N/A  . Number of Children: 0  . Years of Education: N/A   Occupational History  . [place] [employer]   Social History Main Topics  . Smoking status: Former Research scientist (life sciences)  . Smokeless tobacco: Never Used  . Alcohol Use: 0.0 oz/week    0 Standard drinks or equivalent per week     Comment: 1-2 a month  . Drug Use: No  . Sexual Activity: No     Comment: husband vasectomy   Other Topics Concern  . Not on file   Social History Narrative      Review of systems: Review of Systems  Constitutional: Negative for fever and chills.  HENT: Negative.   Eyes: Negative for blurred vision.  Respiratory: Negative for cough, shortness of breath and wheezing.   Cardiovascular: Negative for chest pain and palpitations.  Gastrointestinal: as per HPI Genitourinary: Negative for dysuria, urgency, frequency and hematuria.  Musculoskeletal: Negative for myalgias, back pain and joint pain.  Skin: Negative for itching and rash.  Neurological: Negative for dizziness, tremors, focal weakness, seizures and loss of consciousness.  Endo/Heme/Allergies: Negative for environmental allergies.  Psychiatric/Behavioral: Negative for depression, suicidal ideas and hallucinations.  All other systems reviewed and are negative.   Physical Exam: Filed Vitals:   10/22/15 1550  BP: 108/76  Pulse: 88  Temp: 98.7 F (37.1 C)   Gen:      No acute distress HEENT:  EOMI, sclera anicteric Neck:     No masses; no thyromegaly Lungs:    Clear to auscultation bilaterally; normal respiratory effort CV:         Regular rate and rhythm; no murmurs Abd:      + bowel sounds; soft, non-tender; no palpable masses, no distension Ext:    No edema; adequate peripheral perfusion Skin:      Warm and dry; no rash Neuro: alert and oriented x 3 Psych: normal mood and affect  Data Reviewed:  Reviewed chart in epic   Assessment and Plan/Recommendations: 40 yr F with h/o chronic GERD on chronic PPI here for  follow up Discussed the potential benefit and risk with long term PPI and also discussed alternatives including anti reflux surgery which patient is not interested in pursuing.  Given her significant breakthrough acid reflux symptoms and h/o esophageal stricture requiring dilation, patient agrees to continue PPI therapy.  Oral mucosal burning sensation possible related to mucositis to vitamin deficiency vs hypothyroidism Check B1,folate, B12, niacin and riboflavin level. TSH, CBC and CMP Return in 1 year   K. Denzil Magnuson , MD [phone] Mon-Fri 8a-5p [phone] after 5p, weekends, holidays  CC: Wendy Pepper, MD

## 2015-10-23 NOTE — Telephone Encounter (Signed)
Patient calling requesting assistance with her medications. She said, "I'd like to try the hormone replacement therapy Dr. Talbert Nan and I talked about. I've tried the anti-depressants but I don't like them. 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

## 2015-10-23 NOTE — Telephone Encounter (Signed)
Left message to call Rian Koon at [phone]. 

## 2015-10-23 NOTE — Telephone Encounter (Signed)
Spoke with patient. Patient states that when she was last seen with Dr.Jertson in April they discussed HRT and anti-depressant medications. Patient states she took Cymbalta for 2 weeks to "try to see if it would help, but it made me feel awful. I felt nauseous all the time and like a zombie. I cannot be like that. 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 States she was on HRT 3 years ago but hard some bleeding with taking HRT. Patient would like to restart HRT at this time. Advised I will speak with Dr.Jertson regarding restarting HRT at this time and return call. She is agreeable and verbalizes understanding.

## 2015-10-23 NOTE — Telephone Encounter (Signed)
It's not recommended to start her on HRT just for her mood secondary to the risks. It is really just indicated for bad vasomotor symptoms. We can try a different antidepressant, low dose, or she can come in to further discuss. If she wants to try another medication, I would try celexa 20 mg, start with 1/2 tab a day, increase to 1 tab a day if tolerated in 1-2 weeks. #30, 1 refill with f/u in 1 month.

## 2015-10-26 LAB — VITAMIN B2(RIBOFLAVIN),PLASMA: Vitamin B2(Riboflavin),Plasma: <5 nmol/L — ABNORMAL LOW (ref 6.2–39.0)

## 2015-10-26 LAB — VITAMIN B1: VITAMIN B1 (THIAMINE): 10 nmol/L (ref 8–30)

## 2015-11-26 ENCOUNTER — Telehealth: Payer: Self-pay | Admitting: Gastroenterology

## 2015-11-26 NOTE — Telephone Encounter (Signed)
[facility] Pharmacist ran the prescription through the insurance. She will have a $47 co-pay. Left a message for the patient to call back. Does she want to purchase OTC or change the Rx. No PA is required.

## 2015-11-26 NOTE — Telephone Encounter (Signed)
Waiting on return call.

## 2016-09-18 ENCOUNTER — Encounter: Payer: Self-pay | Admitting: Obstetrics and Gynecology

## 2016-09-18 ENCOUNTER — Ambulatory Visit (INDEPENDENT_AMBULATORY_CARE_PROVIDER_SITE_OTHER): Payer: BC Managed Care – PPO | Admitting: Obstetrics and Gynecology

## 2016-09-18 VITALS — BP 122/80 | HR 80 | Resp 16 | Ht <= 58 in | Wt 194.0 lb

## 2016-09-18 DIAGNOSIS — Z01419 Encounter for gynecological examination (general) (routine) without abnormal findings: Secondary | ICD-10-CM | POA: Diagnosis not present

## 2016-09-18 NOTE — Progress Notes (Signed)
56 y.o. [id number] MarriedCaucasianF here for annual exam.   She sometimes feels some pressure in her lower abdomen/pubic area, goes away after BM.  No bleeding. Husband doing well, not sexually active, ED.  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 She is hoping to get a part time job in [place]. She is so much happier.  She is planning to loose weight.  She has recurrent oral ulcers. She also c/o arthritis, pain in her joints. She will talk to her primary referral to Rheumatology.    Patient's last menstrual period was 06/09/2012.          Sexually active: No.  The current method of family planning is post menopausal status.    Exercising: No.  The patient does not participate in regular exercise at present. Smoker:  Former smoker  Health Maintenance: Pap:  09-17-15 WNL NEG HR HPV 11-19-12 WNL NEG HR HPV  History of abnormal Pap:  no MMG:  09-08-15 WNL  Colonoscopy:  11-23-09 WNL  BMD:   Never TDaP:  2017 Gardasil: N/A   reports that she has quit smoking. She has never used smokeless tobacco. She reports that she drinks alcohol. She reports that she does not use drugs. 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   Past Medical History:  Diagnosis Date  . Allergic rhinitis   . Anemia   . Anxiety   . DM (diabetes mellitus) ([place])   . Esophageal stricture   . Fundic gland polyps of stomach, benign   . GERD (gastroesophageal reflux disease)   . Hepatic steatosis   . Hiatal hernia   . Hypothyroid   . Kidney stones   . Panic disorder   . Sleep apnea    test negative, does not use anything    Past Surgical History:  Procedure Laterality Date  . CHOLECYSTECTOMY    . DILATION AND CURETTAGE OF UTERUS    . sinsus surgery  1996-1997   sinsus drainage   . TONSILLECTOMY      Current Outpatient Prescriptions  Medication Sig Dispense Refill  . aspirin 81 MG tablet Take 81 mg by mouth daily.       . cetirizine (ZYRTEC) 10 MG tablet Take 1 tablet (10 mg total) by mouth daily. 90 tablet 3  . Cholecalciferol (VITAMIN D3) 2000 UNITS capsule Take 2,000 Units by mouth daily.      . fluticasone (FLONASE) 50 MCG/ACT nasal spray Place 2 sprays into the nose every morning. NEED REFILLS    . IRON PO Take by mouth as needed.    Marland Kitchen levothyroxine (SYNTHROID, LEVOTHROID) 75 MCG tablet Take 75 mcg by mouth daily.  1  . metFORMIN (GLUCOPHAGE-XR) 500 MG 24 hr tablet Take 2,000 mg by mouth every evening.     . montelukast (SINGULAIR) 10 MG tablet Take one tablet by mouth daily. 30 tablet 2  . omeprazole (PRILOSEC OTC) 20 MG tablet Take 20 mg by mouth daily.    Marland Kitchen PROAIR HFA 108 (90 BASE) MCG/ACT inhaler INHALE 2 PUFFS 4 TIMES A DAY AS NEEDED...NEEDS OFFICE VISIT FOR MORE (Patient taking differently: INHALE 2 PUFFS 4 TIMES A DAY AS NEEDED SOB) 8.5 each 1   No current facility-administered medications for this visit.     Family History  Problem Relation Age of Onset  . Colon cancer Neg Hx   . Diabetes Sister   . Diabetes Mother   . Stomach  cancer Brother     drinker  . Diabetes Brother   . Arthritis      through out the family  . Diabetes Sister     Review of Systems  Constitutional: Negative.   HENT: Negative.   Eyes: Negative.   Respiratory: Negative.   Cardiovascular: Negative.   Gastrointestinal: Negative.   Endocrine: Negative.   Genitourinary: Negative.   Musculoskeletal: Negative.   Skin: Negative.   Allergic/Immunologic: Negative.   Neurological: Negative.   Psychiatric/Behavioral: Negative.     Exam:   BP 122/80 (BP Location: Right Arm, Patient Position: Sitting, Cuff Size: Normal)   Pulse 80   Resp 16   Ht 4\' 10"  (1.473 m)   Wt 194 lb (88 kg)   LMP 06/09/2012   BMI 40.55 kg/m   Weight change: @WEIGHTCHANGE @ Height:   Height: 4\' 10"  (147.3 cm)  Ht Readings from Last 3 Encounters:  09/18/16 4\' 10"  (1.473 m)  10/22/15 4\' 10"  (1.473 m)  09/17/15 4\' 10"  (1.473 m)     General appearance: alert, cooperative and appears stated age Head: Normocephalic, without obvious abnormality, atraumatic Neck: no adenopathy, supple, symmetrical, trachea midline and thyroid normal to inspection and palpation Lungs: clear to auscultation bilaterally Cardiovascular: regular rate and rhythm Breasts: normal appearance, no masses or tenderness Abdomen: soft, non-tender; bowel sounds normal; no masses,  no organomegaly Extremities: extremities normal, atraumatic, no cyanosis or edema Skin: Skin color, texture, turgor normal. No rashes or lesions Lymph nodes: Cervical, supraclavicular, and axillary nodes normal. No abnormal inguinal nodes palpated Neurologic: Grossly normal   Pelvic: External genitalia:  no lesions              Urethra:  normal appearing urethra with no masses, tenderness or lesions              Bartholins and Skenes: normal                 Vagina: normal appearing atrophic vagina with normal color and discharge, no lesions              Cervix: no lesions               Bimanual Exam:  Uterus:  Exam limited by BMI, no masses, not tender              Adnexa: no mass, fullness, tenderness               Rectovaginal: Confirms               Anus:  normal sphincter tone, no lesions  Chaperone was present for exam.  A:  Well Woman with normal exam  P:   No pap this year  Mammogram due  Colonoscopy UTD  Labs with her primary MD  Discussed breast self exam  Discussed calcium and vit D intake

## 2016-09-18 NOTE — Patient Instructions (Signed)

## 2016-11-18 ENCOUNTER — Other Ambulatory Visit: Payer: Self-pay | Admitting: Family Medicine

## 2016-11-18 DIAGNOSIS — Z1231 Encounter for screening mammogram for malignant neoplasm of breast: Secondary | ICD-10-CM

## 2016-12-01 ENCOUNTER — Ambulatory Visit
Admission: RE | Admit: 2016-12-01 | Discharge: 2016-12-01 | Disposition: A | Discharge status: Home / Self Care | Payer: BC Managed Care – PPO | Source: Ambulatory Visit | Attending: Family Medicine | Admitting: Family Medicine

## 2016-12-01 DIAGNOSIS — Z1231 Encounter for screening mammogram for malignant neoplasm of breast: Secondary | ICD-10-CM

## 2017-02-11 ENCOUNTER — Encounter: Payer: BC Managed Care – PPO | Attending: Family Medicine | Admitting: *Deleted

## 2017-02-11 DIAGNOSIS — E119 Type 2 diabetes mellitus without complications: Secondary | ICD-10-CM | POA: Diagnosis not present

## 2017-02-11 DIAGNOSIS — Z713 Dietary counseling and surveillance: Secondary | ICD-10-CM | POA: Insufficient documentation

## 2017-02-11 NOTE — Progress Notes (Signed)
Diabetes Self-Management Education  Visit Type: First/Initial  Appt. Start Time: 1030 Appt. End Time: 1200  02/11/2017  [name], identified by name and date of birth, is a 56 y.o. female with a diagnosis of Diabetes: Type 2.  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 She declines testing her BG due to finding it very stressful and she feels she knows the consequences of when she eats too much food anyway. Activity appears sporadic, she is interested in swimming in the near future.  ASSESSMENT  Last menstrual period 06/09/2012. There is no height or weight on file to calculate BMI.      Diabetes Self-Management Education - 02/11/17 1039      Visit Information   Visit Type First/Initial     Initial Visit   Diabetes Type Type 2   Are you currently following a meal plan? Yes   What type of meal plan do you follow? LImit carbs   Are you taking your medications as prescribed? Yes   Date Diagnosed 2015     Health Coping   How would you rate your overall health? Good     Psychosocial Assessment   Patient Belief/Attitude about Diabetes Motivated to manage diabetes   Self-care barriers None   Self-management support Doctor's office   Other persons present Patient   Patient Concerns Nutrition/Meal planning;Glycemic Control;Weight Control   Special Needs None   Preferred Learning Style No preference indicated   Learning Readiness Change in progress   How often do you need to have someone help you when you read instructions, pamphlets, or other written materials from your doctor or pharmacy? 1 - Never   What is the last grade level you completed in school? 12     Pre-Education Assessment   Patient understands the diabetes disease and treatment process. Needs Review   Patient understands incorporating nutritional management into lifestyle. Needs Review   Patient  undertands incorporating physical activity into lifestyle. Needs Instruction   Patient understands using medications safely. Needs Review   Patient understands monitoring blood glucose, interpreting and using results Needs Instruction   Patient understands prevention, detection, and treatment of acute complications. Needs Review   Patient understands prevention, detection, and treatment of chronic complications. Needs Instruction   Patient understands how to develop strategies to address psychosocial issues. Needs Review   Patient understands how to develop strategies to promote health/change behavior. Needs Review     Complications   Last HgB A1C per patient/outside source 6.3 %   How often do you check your blood sugar? Patient declines  too traumatic to prick finger   Number of hypoglycemic episodes per month 0   Have you had a dilated eye exam in the past 12 months? Yes   Have you had a dental exam in the past 12 months? Yes   Are you checking your feet? Yes   How many days per week are you checking your feet? 4     Dietary Intake   Breakfast 2 pcs toast with egg OR left over lentils on toast   Snack (morning) animal crackers OR wheat thins OR fresh fruit   Lunch sandwich and fresh fruit or added lettuce and tomato   Snack (afternoon) same as AM snacks, maybe raw vegetatbles and hummus   Dinner rice OR bread, typically lean meat, vegetables and/or salad   Snack (evening)  fresh fruit   Beverage(s) water, flavored water, hot tea, coffee with fat free powder     Exercise   Exercise Type Light (walking / raking leaves)   How many days per week to you exercise? 3   How many minutes per day do you exercise? 20   Total minutes per week of exercise 60     Patient Education   Previous Diabetes Education No   Disease state  Definition of diabetes, type 1 and 2, and the diagnosis of diabetes   Nutrition management  Role of diet in the treatment of diabetes and the relationship between  the three main macronutrients and blood glucose level;Food label reading, portion sizes and measuring food.;Carbohydrate counting   Physical activity and exercise  Role of exercise on diabetes management, blood pressure control and cardiac health.   Medications Reviewed patients medication for diabetes, action, purpose, timing of dose and side effects.   Monitoring Purpose and frequency of SMBG.;Identified appropriate SMBG and/or A1C goals.;Daily foot exams   Chronic complications Relationship between chronic complications and blood glucose control   Psychosocial adjustment Worked with patient to identify barriers to care and solutions     Individualized Goals (developed by patient)   Nutrition Follow meal plan discussed   Physical Activity Exercise 3-5 times per week   Medications take my medication as prescribed   Monitoring  test blood glucose pre and post meals as discussed     Post-Education Assessment   Patient understands the diabetes disease and treatment process. Demonstrates understanding / competency   Patient understands incorporating nutritional management into lifestyle. Demonstrates understanding / competency   Patient undertands incorporating physical activity into lifestyle. Demonstrates understanding / competency   Patient understands using medications safely. Demonstrates understanding / competency   Patient understands monitoring blood glucose, interpreting and using results Demonstrates understanding / competency   Patient understands prevention, detection, and treatment of acute complications. Demonstrates understanding / competency   Patient understands prevention, detection, and treatment of chronic complications. Demonstrates understanding / competency   Patient understands how to develop strategies to address psychosocial issues. Demonstrates understanding / competency   Patient understands how to develop strategies to promote health/change behavior. Demonstrates  understanding / competency     Outcomes   Expected Outcomes Demonstrated interest in learning. Expect positive outcomes   Future DMSE PRN   Program Status Completed      Individualized Plan for Diabetes Self-Management Training:   Learning Objective:  Patient will have a greater understanding of diabetes self-management. Patient education plan is to attend individual and/or group sessions per assessed needs and concerns.   Plan:   Patient Instructions  Plan:  Aim for 2-3 Carb Choices per meal (30-45 grams)   Aim for 0-1 Carbs per snack if hungry  Include lean protein in moderation with your meals and snacks Continue reading food labels for Total Carbohydrate of foods Continue with your activity level by walking, swimming or arm chair exercises daily as tolerated Consider checking BG at alternate times per day  Continue taking medication as directed by MD   Expected Outcomes:  Demonstrated interest in learning. Expect positive outcomes  Education material provided: Living Well with Diabetes, A1C conversion sheet, Meal plan card and Carbohydrate counting sheet  If problems or questions, patient to contact team via:  Phone  Future DSME appointment: PRN  Depending on insurance coverage for this and future visits.

## 2017-02-11 NOTE — Patient Instructions (Addendum)
Plan:  Aim for 2-3 Carb Choices per meal (30-45 grams)   Aim for 0-1 Carbs per snack if hungry  Include lean protein in moderation with your meals and snacks Continue reading food labels for Total Carbohydrate of foods Continue with your activity level by walking, swimming or arm chair exercises daily as tolerated Consider checking BG at alternate times per day  Continue taking medication as directed by MD

## 2017-09-21 ENCOUNTER — Ambulatory Visit: Payer: BC Managed Care – PPO | Admitting: Obstetrics and Gynecology

## 2017-10-02 DIAGNOSIS — L439 Lichen planus, unspecified: Secondary | ICD-10-CM | POA: Insufficient documentation

## 2017-10-05 ENCOUNTER — Ambulatory Visit: Payer: BC Managed Care – PPO | Admitting: Obstetrics and Gynecology

## 2017-10-05 ENCOUNTER — Encounter: Payer: Self-pay | Admitting: Obstetrics and Gynecology

## 2017-10-05 ENCOUNTER — Other Ambulatory Visit: Payer: Self-pay

## 2017-10-05 VITALS — BP 104/60 | HR 80 | Resp 16 | Ht 58.25 in | Wt 194.0 lb

## 2017-10-05 DIAGNOSIS — Z01419 Encounter for gynecological examination (general) (routine) without abnormal findings: Secondary | ICD-10-CM | POA: Diagnosis not present

## 2017-10-05 NOTE — Progress Notes (Signed)
57 y.o. [id number] MarriedCaucasianF here for annual exam.  No vaginal bleeding. She notices a fishy odor in her urine and stool only if she eats fish, only lasts for a day.  Not sexually active secondary to ED.   She is having trouble loosing weight. She states her diabetes is stable, last HgbA1C was 6.5%. 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     Patient's last menstrual period was 06/09/2012.          Sexually active: No.  The current method of family planning is post menopausal status.    Exercising: No.  The patient does not participate in regular exercise at present. Smoker:  Former smoker  Health Maintenance: Pap:  09-17-15 WNL NEG HR HPV 11-19-12 WNL NEG HR HPV  History of abnormal Pap:  no MMG:  12-01-16  WNL  Colonoscopy:  11-23-09 WNL  BMD:   07-12-09 WNL  TDaP: 2019    Gardasil: N/A   reports that she has quit smoking. She has never used smokeless tobacco. She reports that she drinks alcohol. She reports that she does not use drugs. 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    Past Medical History:  Diagnosis Date  . Allergic rhinitis   . Anemia   . Anxiety   . DM (diabetes mellitus) ([place])   . Esophageal stricture   . Fundic gland polyps of stomach, benign   . GERD (gastroesophageal reflux disease)   . Hepatic steatosis   . Hiatal hernia   . Hypothyroid   . Kidney stones   . Panic disorder   . Sleep apnea    test negative, does not use anything    Past Surgical History:  Procedure Laterality Date  . CHOLECYSTECTOMY    . DILATION AND CURETTAGE OF UTERUS    . sinsus surgery  1996-1997   sinsus drainage   . TONSILLECTOMY      Current Outpatient Medications  Medication Sig Dispense Refill  . aspirin 81 MG tablet Take 81 mg by mouth daily.      . cetirizine (ZYRTEC) 10 MG tablet Take 1 tablet (10 mg total) by mouth daily. 90 tablet 3  . Cholecalciferol (VITAMIN D3) 2000 UNITS capsule Take 2,000 Units by mouth daily.       . fluticasone (FLONASE) 50 MCG/ACT nasal spray Place 2 sprays into the nose every morning. NEED REFILLS    . IRON PO Take by mouth as needed.    Marland Kitchen levothyroxine (SYNTHROID, LEVOTHROID) 75 MCG tablet Take 75 mcg by mouth daily.  1  . metFORMIN (GLUCOPHAGE-XR) 500 MG 24 hr tablet Take 2,000 mg by mouth every evening.     . montelukast (SINGULAIR) 10 MG tablet Take one tablet by mouth daily. 30 tablet 2  . omeprazole (PRILOSEC OTC) 20 MG tablet Take 20 mg by mouth daily.    Marland Kitchen PROAIR HFA 108 (90 BASE) MCG/ACT inhaler INHALE 2 PUFFS 4 TIMES A DAY AS NEEDED...NEEDS OFFICE VISIT FOR MORE (Patient taking differently: INHALE 2 PUFFS 4 TIMES A DAY AS NEEDED SOB) 8.5 each 1  . rosuvastatin (CRESTOR) 5 MG tablet      No current facility-administered medications for this visit.     Family History  Problem Relation Age of Onset  . Diabetes Sister   . Diabetes Mother   . Stomach cancer Brother        drinker  . Diabetes Brother   . Arthritis Unknown  through out the family  . Diabetes Sister   . Colon cancer Neg Hx   Sister died in the last year in 2023-05-13. She had severe arthritis, died of a complication of surgery.  She is one of 12 kids, only 6 surviving. She is the youngest. She is from [place].   Review of Systems  Constitutional: Negative.   HENT: Positive for sinus pain.   Eyes: Negative.   Respiratory: Negative.   Cardiovascular: Negative.   Gastrointestinal: Negative.   Endocrine: Negative.   Genitourinary: Negative.   Musculoskeletal: Positive for myalgias.  Skin: Negative.   Allergic/Immunologic: Negative.   Neurological: Negative.   Psychiatric/Behavioral: Negative.     Exam:   BP 104/60 (BP Location: Right Arm, Patient Position: Sitting, Cuff Size: Normal)   Pulse 80   Resp 16   Ht 4' 10.25" (1.48 m)   Wt 194 lb (88 kg)   LMP 06/09/2012   BMI 40.20 kg/m   Weight change: @WEIGHTCHANGE @ Height:   Height: 4' 10.25" (148 cm)  Ht Readings from Last 3  Encounters:  10/05/17 4' 10.25" (1.48 m)  09/18/16 4\' 10"  (1.473 m)  10/22/15 4\' 10"  (1.473 m)    General appearance: alert, cooperative and appears stated age Head: Normocephalic, without obvious abnormality, atraumatic Neck: no adenopathy, supple, symmetrical, trachea midline and thyroid normal to inspection and palpation Lungs: clear to auscultation bilaterally Cardiovascular: regular rate and rhythm Breasts: normal appearance, no masses or tenderness Abdomen: soft, non-tender; non distended,  no masses,  no organomegaly Extremities: extremities normal, atraumatic, no cyanosis or edema Skin: Skin color, texture, turgor normal. No rashes or lesions Lymph nodes: Cervical, supraclavicular, and axillary nodes normal. No abnormal inguinal nodes palpated Neurologic: Grossly normal   Pelvic: External genitalia:  no lesions              Urethra:  normal appearing urethra with no masses, tenderness or lesions              Bartholins and Skenes: normal                 Vagina: normal appearing vagina with normal color and discharge, no lesions              Cervix: no lesions               Bimanual Exam:  Uterus:  no masses or tenderness, uterine palpation limited by BMI.               Adnexa: no mass, fullness, tenderness               Rectovaginal: Confirms               Anus:  normal sphincter tone, no lesions  Chaperone was present for exam.  A:  Well Woman with normal exam  BMI 40  Medical issues followed by primary, DM is under control  P:   Pap next year  Mammogram in 6/19  Colonoscopy is UTD  Discussed breast self exam  Discussed calcium and vit D intake

## 2017-10-05 NOTE — Patient Instructions (Signed)

## 2017-10-09 ENCOUNTER — Ambulatory Visit: Payer: BC Managed Care – PPO | Admitting: Obstetrics and Gynecology

## 2017-10-09 ENCOUNTER — Encounter

## 2018-01-11 ENCOUNTER — Ambulatory Visit
Admission: RE | Admit: 2018-01-11 | Discharge: 2018-01-11 | Disposition: A | Discharge status: Home / Self Care | Payer: BLUE CROSS/BLUE SHIELD | Source: Ambulatory Visit | Attending: Family Medicine | Admitting: Family Medicine

## 2018-01-11 ENCOUNTER — Other Ambulatory Visit: Payer: Self-pay | Admitting: Family Medicine

## 2018-01-11 DIAGNOSIS — Z1231 Encounter for screening mammogram for malignant neoplasm of breast: Secondary | ICD-10-CM

## 2018-10-07 ENCOUNTER — Ambulatory Visit: Payer: BC Managed Care – PPO | Admitting: Obstetrics and Gynecology

## 2019-01-11 ENCOUNTER — Other Ambulatory Visit: Payer: Self-pay | Admitting: Family Medicine

## 2019-01-11 DIAGNOSIS — Z1231 Encounter for screening mammogram for malignant neoplasm of breast: Secondary | ICD-10-CM

## 2019-01-14 ENCOUNTER — Ambulatory Visit: Payer: BLUE CROSS/BLUE SHIELD

## 2019-01-19 ENCOUNTER — Other Ambulatory Visit: Payer: Self-pay

## 2019-01-21 ENCOUNTER — Ambulatory Visit: Payer: BC Managed Care – PPO | Admitting: Obstetrics and Gynecology

## 2019-01-21 ENCOUNTER — Encounter: Payer: Self-pay | Admitting: Obstetrics and Gynecology

## 2019-01-21 ENCOUNTER — Ambulatory Visit (INDEPENDENT_AMBULATORY_CARE_PROVIDER_SITE_OTHER): Payer: BC Managed Care – PPO | Admitting: Obstetrics and Gynecology

## 2019-01-21 ENCOUNTER — Other Ambulatory Visit: Payer: Self-pay

## 2019-01-21 VITALS — BP 118/80 | HR 92 | Temp 98.0°F | Ht <= 58 in | Wt 199.8 lb

## 2019-01-21 DIAGNOSIS — Z01419 Encounter for gynecological examination (general) (routine) without abnormal findings: Secondary | ICD-10-CM

## 2019-01-21 NOTE — Patient Instructions (Signed)

## 2019-01-21 NOTE — Progress Notes (Signed)
58 y.o. G2P0020 Married Asian Not Hispanic or Latino female here for annual exam.   Not sexually active secondary to ED. Last month she has a few episodes of pelvic cramping. Resolved.  Baseline urinary frequency and urgency, no change, no leakage. She does drink 4-5 cups of coffee a day. Up to void one x a night, full bladder.   Last HgbA1C was over 7%, she is followed by her primary.    Patient's last menstrual period was 06/09/2012.          Sexually active: No.  The current method of family planning is post menopausal status.    Exercising: No.   Smoker:  no  Health Maintenance: Pap:  09/17/15 Neg. HR HPV:neg   11/19/12 Neg. HR HPV:neg  History of abnormal Pap:  no MMG:  01/11/18 BIRADS1:neg. Has appt 9/15/120 BMD:   07/12/09 Normal  Colonoscopy: 11/23/09 normal. F/u 10 years  TDaP:  2019 Gardasil: n/a   reports that she has quit smoking. She has never used smokeless tobacco. She reports current alcohol use. She reports that she does not use drugs. 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  Past Medical History:  Diagnosis Date  . Allergic rhinitis   . Anemia   . Anxiety   . DM (diabetes mellitus) ([place])   . Esophageal stricture   . Fundic gland polyps of stomach, benign   . GERD (gastroesophageal reflux disease)   . Hepatic steatosis   . Hiatal hernia   . Hypothyroid   . Kidney stones   . Panic disorder   . Sleep apnea    test negative, does not use anything    Past Surgical History:  Procedure Laterality Date  . CHOLECYSTECTOMY    . DILATION AND CURETTAGE OF UTERUS    . sinsus surgery  1996-1997   sinsus drainage   . TONSILLECTOMY      Current Outpatient Medications  Medication Sig Dispense Refill  . aspirin 81 MG tablet Take 81 mg by mouth daily.      . cetirizine (ZYRTEC) 10 MG tablet Take 1 tablet (10 mg total) by mouth daily. 90 tablet 3  . Cholecalciferol (VITAMIN D3) 2000 UNITS  capsule Take 2,000 Units by mouth daily.      . fluticasone (FLONASE) 50 MCG/ACT nasal spray Place 2 sprays into the nose every morning. NEED REFILLS    . IRON PO Take by mouth as needed.    Marland Kitchen levothyroxine (SYNTHROID, LEVOTHROID) 75 MCG tablet Take 75 mcg by mouth daily.  1  . metFORMIN (GLUCOPHAGE-XR) 500 MG 24 hr tablet Take 2,000 mg by mouth every evening.     . montelukast (SINGULAIR) 10 MG tablet Take one tablet by mouth daily. 30 tablet 2  . omeprazole (PRILOSEC OTC) 20 MG tablet Take 20 mg by mouth daily.    Marland Kitchen PROAIR HFA 108 (90 BASE) MCG/ACT inhaler INHALE 2 PUFFS 4 TIMES A DAY AS NEEDED...NEEDS OFFICE VISIT FOR MORE (Patient taking differently: INHALE 2 PUFFS 4 TIMES A DAY AS NEEDED SOB) 8.5 each 1  . rosuvastatin (CRESTOR) 5 MG tablet     . sucralfate (CARAFATE) 1 GM/10ML suspension TAKE 10 ML TWICE DAILY ON AN EMPTY STOMACH AS NEEDED     No current facility-administered medications for this visit.     Family History  Problem Relation Age of Onset  . Diabetes Sister   . Diabetes Mother   . Stomach cancer Brother  drinker  . Diabetes Brother   . Arthritis Other        through out the family  . Diabetes Sister   . Colon cancer Neg Hx     Review of Systems  Constitutional: Negative.   HENT: Negative.   Eyes: Negative.   Respiratory: Negative.   Cardiovascular: Negative.   Gastrointestinal: Negative.   Endocrine: Negative.   Genitourinary: Negative.        Episode of cramping about a month ago.  Musculoskeletal: Negative.   Skin: Negative.   Allergic/Immunologic: Negative.   Neurological: Negative.   Hematological: Negative.   Psychiatric/Behavioral: Negative.     Exam:   BP 118/80   Pulse 92   Temp 98 F (36.7 C) (Temporal)   Ht 4\' 10"  (1.473 m)   Wt 199 lb 12.8 oz (90.6 kg)   LMP 06/09/2012   BMI 41.76 kg/m   Weight change: @WEIGHTCHANGE @ Height:   Height: 4\' 10"  (147.3 cm)  Ht Readings from Last 3 Encounters:  01/21/19 4\' 10"  (1.473 m)   10/05/17 4' 10.25" (1.48 m)  09/18/16 4\' 10"  (1.473 m)    General appearance: alert, cooperative and appears stated age Head: Normocephalic, without obvious abnormality, atraumatic Neck: no adenopathy, supple, symmetrical, trachea midline and thyroid normal to inspection and palpation Lungs: clear to auscultation bilaterally Cardiovascular: regular rate and rhythm Breasts: normal appearance, no masses or tenderness Abdomen: soft, non-tender; non distended,  no masses,  no organomegaly Extremities: extremities normal, atraumatic, no cyanosis or edema Skin: Skin color, texture, turgor normal. No rashes or lesions Lymph nodes: Cervical, supraclavicular, and axillary nodes normal. No abnormal inguinal nodes palpated Neurologic: Grossly normal   Pelvic: External genitalia:  no lesions              Urethra:  normal appearing urethra with no masses, tenderness or lesions              Bartholins and Skenes: normal                 Vagina: atrophic appearing vagina with normal color and discharge, no lesions              Cervix: no lesions               Bimanual Exam:  Uterus:  mobile, not appreciably enlarged, not tender              Adnexa: no mass, fullness, tenderness               Rectovaginal: Confirms               Anus:  normal sphincter tone, no lesions  Chaperone was present for exam.  A:  Well Woman with normal exam  BMI 41  Medical issues followed by primary MD  P:   No pap this year (will check in 2022)  Mammogram in 9/20  Colonoscopy in 6/21  Labs with primary  Discussed breast self exam  Discussed calcium and vit D intake

## 2019-02-22 ENCOUNTER — Other Ambulatory Visit: Payer: Self-pay

## 2019-02-22 ENCOUNTER — Ambulatory Visit: Payer: BLUE CROSS/BLUE SHIELD

## 2019-02-22 ENCOUNTER — Ambulatory Visit
Admission: RE | Admit: 2019-02-22 | Discharge: 2019-02-22 | Disposition: A | Discharge status: Home / Self Care | Payer: BC Managed Care – PPO | Source: Ambulatory Visit | Attending: Family Medicine | Admitting: Family Medicine

## 2019-02-22 DIAGNOSIS — Z1231 Encounter for screening mammogram for malignant neoplasm of breast: Secondary | ICD-10-CM

## 2019-10-25 ENCOUNTER — Encounter: Payer: Self-pay | Admitting: Gastroenterology

## 2019-11-17 ENCOUNTER — Ambulatory Visit: Payer: BC Managed Care – PPO | Admitting: Physician Assistant

## 2019-12-02 ENCOUNTER — Ambulatory Visit: Payer: BC Managed Care – PPO | Admitting: Gastroenterology

## 2019-12-02 ENCOUNTER — Encounter: Payer: Self-pay | Admitting: Gastroenterology

## 2019-12-02 VITALS — BP 110/70 | HR 80 | Ht <= 58 in | Wt 201.2 lb

## 2019-12-02 DIAGNOSIS — K449 Diaphragmatic hernia without obstruction or gangrene: Secondary | ICD-10-CM | POA: Diagnosis not present

## 2019-12-02 DIAGNOSIS — K219 Gastro-esophageal reflux disease without esophagitis: Secondary | ICD-10-CM | POA: Diagnosis not present

## 2019-12-02 DIAGNOSIS — Z1211 Encounter for screening for malignant neoplasm of colon: Secondary | ICD-10-CM

## 2019-12-02 DIAGNOSIS — R1310 Dysphagia, unspecified: Secondary | ICD-10-CM | POA: Diagnosis not present

## 2019-12-02 MED ORDER — OMEPRAZOLE 40 MG PO CPDR
40.0000 mg | DELAYED_RELEASE_CAPSULE | Freq: Every day | ORAL | 11 refills | Status: DC
Start: 2019-12-02 — End: 2024-01-25

## 2019-12-02 MED ORDER — SUTAB 1479-225-188 MG PO TABS: ORAL_TABLET | ORAL | 0 refills | Status: DC

## 2019-12-02 MED ORDER — SUCRALFATE 1 G PO TABS
1.0000 g | ORAL_TABLET | Freq: Three times a day (TID) | ORAL | 3 refills | Status: AC
Start: 2019-12-02 — End: ?

## 2019-12-02 NOTE — Progress Notes (Signed)
Wendy Curtis    [id number]    07-12-1960  Primary Care Physician:Koirala, Dibas, MD  Referring Physician: Lujean Amel, MD [facility] [address],  [place] [address]   Chief complaint:  Hiatal hernia, GERD, constipation, history of colon polyps  HPI:  59 year old very pleasant female here to reestablish care.  Last office visit in May 2017 She recently retired  She continues to have intermittent breakthrough heartburn, GERD symptoms, atypical chest discomfort and dysphagia.  Sucralfate has helped her symptoms to some extent.  She is using over-the-counter Prilosec because her insurance is no longer covering PPI.  EGD July 2015 showed mild esophageal stricture, status post dilation with Savary 35mm.  5 cm hiatal hernia and negative for Barrett's esophagus  Her brother had gastric cancer  She is s/p cholecystectomy  Colonoscopy June 2011 no polyps  Due for recall colonoscopy    Outpatient Encounter Medications as of 12/02/2019  Medication Sig  . aspirin 81 MG tablet Take 81 mg by mouth daily.    . cetirizine (ZYRTEC) 10 MG tablet Take 1 tablet (10 mg total) by mouth daily.  . Cholecalciferol (VITAMIN D3) 2000 UNITS capsule Take 2,000 Units by mouth daily.    . fluticasone (FLONASE) 50 MCG/ACT nasal spray Place 2 sprays into the nose every morning. NEED REFILLS  . levothyroxine (SYNTHROID, LEVOTHROID) 75 MCG tablet Take 75 mcg by mouth daily.  . metFORMIN (GLUCOPHAGE-XR) 500 MG 24 hr tablet Take 1,000 mg by mouth in the morning and at bedtime.   . montelukast (SINGULAIR) 10 MG tablet Take one tablet by mouth daily.  Marland Kitchen omeprazole (PRILOSEC OTC) 20 MG tablet Take 20 mg by mouth daily.  Marland Kitchen PROAIR HFA 108 (90 BASE) MCG/ACT inhaler INHALE 2 PUFFS 4 TIMES A DAY AS NEEDED...NEEDS OFFICE VISIT FOR MORE (Patient taking differently: INHALE 2 PUFFS 4 TIMES A DAY AS NEEDED SOB)  . rosuvastatin (CRESTOR) 5 MG tablet   . sucralfate (CARAFATE) 1 GM/10ML  suspension TAKE 10 ML TWICE DAILY ON AN EMPTY STOMACH AS NEEDED  . [DISCONTINUED] IRON PO Take by mouth as needed.   No facility-administered encounter medications on file as of 12/02/2019.    Allergies as of 12/02/2019 - Review Complete 12/02/2019  Allergen Reaction Noted  . Celebrex [celecoxib]  09/12/2011  . Meloxicam  09/12/2011  . Procaine hcl Other (See Comments) 08/27/2009  . Penicillins Rash 08/27/2009    Past Medical History:  Diagnosis Date  . Allergic rhinitis   . Anemia   . Anxiety   . DM (diabetes mellitus) ([place])   . Esophageal stricture   . Fundic gland polyps of stomach, benign   . GERD (gastroesophageal reflux disease)   . Hepatic steatosis   . Hiatal hernia   . Hypothyroid   . Kidney stones   . Panic disorder   . Sleep apnea    test negative, does not use anything    Past Surgical History:  Procedure Laterality Date  . CHOLECYSTECTOMY    . DILATION AND CURETTAGE OF UTERUS    . sinsus surgery  1996-1997   sinsus drainage   . TONSILLECTOMY      Family History  Problem Relation Age of Onset  . Diabetes Sister   . Diabetes Mother   . Stomach cancer Brother        drinker  . Diabetes Brother   . Arthritis Other        through out the family  .  Diabetes Sister   . Colon cancer Neg Hx     Social History   Socioeconomic History  . Marital status: Married    Spouse name: Not on file  . Number of children: 0  . Years of education: Not on file  . Highest education level: Not on file  Occupational History  . Occupation: Product manager: [employer]  Tobacco Use  . Smoking status: Former Research scientist (life sciences)  . Smokeless tobacco: Never Used  Vaping Use  . Vaping Use: Never used  Substance and Sexual Activity  . Alcohol use: Yes    Alcohol/week: 0.0 - 1.0 standard drinks  . Drug use: No  . Sexual activity: Not Currently    Partners: Male    Comment: husband vasectomy  Other Topics Concern  . Not on file  Social History Narrative  .  Not on file   Social Determinants of Health   Financial Resource Strain:   . Difficulty of Paying Living Expenses:   Food Insecurity:   . Worried About Charity fundraiser in the Last Year:   . Arboriculturist in the Last Year:   Transportation Needs:   . Film/video editor (Medical):   Marland Kitchen Lack of Transportation (Non-Medical):   Physical Activity:   . Days of Exercise per Week:   . Minutes of Exercise per Session:   Stress:   . Feeling of Stress :   Social Connections:   . Frequency of Communication with Friends and Family:   . Frequency of Social Gatherings with Friends and Family:   . Attends Religious Services:   . Active Member of Clubs or Organizations:   . Attends Archivist Meetings:   Marland Kitchen Marital Status:   Intimate Partner Violence:   . Fear of Current or Ex-Partner:   . Emotionally Abused:   Marland Kitchen Physically Abused:   . Sexually Abused:       Review of systems:  All other review of systems negative except as mentioned in the HPI.   Physical Exam: Vitals:   12/02/19 1519  BP: 110/70  Pulse: 80   Body mass index is 42.05 kg/m. Gen:      No acute distress Neuro: alert and oriented x 3 Psych: normal mood and affect  Data Reviewed:  Reviewed labs, radiology imaging, old records and pertinent past GI work up   Assessment and Plan/Recommendations:  59 year old very pleasant female with history of chronic GERD and hiatal hernia  GERD: Persistent breakthrough symptoms Schedule for EGD to exclude erosive esophagitis also evaluate hiatal hernia Omeprazole 40 mg daily Use Carafate 1 g before meals and at bedtime as needed Antireflux measures  Due for colorectal cancer screening, average risk Schedule for colonoscopy along with EGD  The risks and benefits as well as alternatives of endoscopic procedure(s) have been discussed and reviewed. All questions answered. The patient agrees to proceed.    The patient was provided an opportunity to ask  questions and all were answered. The patient agreed with the plan and demonstrated an understanding of the instructions.  Damaris Hippo , MD    CC: Lujean Amel, MD

## 2019-12-02 NOTE — Patient Instructions (Addendum)
You have been scheduled for an endoscopy and colonoscopy. Please follow the written instructions given to you at your visit today. Please pick up your prep supplies at the pharmacy within the next 1-3 days. If you use inhalers (even only as needed), please bring them with you on the day of your procedure. Your physician has requested that you go to www.startemmi.com and enter the access code given to you at your visit today. This web site gives a general overview about your procedure. However, you should still follow specific instructions given to you by our office regarding your preparation for the procedure.  We will send Omeprazole 40 mg daily to your pharmacy and also Carafate tablets to take 1 gm three times a day and at bedtime   I appreciate the  opportunity to care for you  Thank You   Harl Bowie , MD

## 2019-12-07 ENCOUNTER — Encounter: Payer: Self-pay | Admitting: Gastroenterology

## 2019-12-23 ENCOUNTER — Encounter: Payer: Self-pay | Admitting: Gastroenterology

## 2020-01-04 ENCOUNTER — Encounter: Payer: BC Managed Care – PPO | Admitting: Gastroenterology

## 2020-01-26 ENCOUNTER — Encounter: Payer: Self-pay | Admitting: Obstetrics and Gynecology

## 2020-01-26 ENCOUNTER — Other Ambulatory Visit: Payer: Self-pay | Admitting: Family Medicine

## 2020-01-26 ENCOUNTER — Ambulatory Visit: Payer: BC Managed Care – PPO | Admitting: Obstetrics and Gynecology

## 2020-01-26 ENCOUNTER — Other Ambulatory Visit (HOSPITAL_COMMUNITY)
Admission: RE | Admit: 2020-01-26 | Discharge: 2020-01-26 | Disposition: A | Discharge status: Home / Self Care | Payer: BC Managed Care – PPO | Source: Ambulatory Visit | Attending: Obstetrics and Gynecology | Admitting: Obstetrics and Gynecology

## 2020-01-26 ENCOUNTER — Other Ambulatory Visit: Payer: Self-pay

## 2020-01-26 VITALS — BP 122/64 | HR 91 | Ht <= 58 in | Wt 198.0 lb

## 2020-01-26 DIAGNOSIS — Z01419 Encounter for gynecological examination (general) (routine) without abnormal findings: Secondary | ICD-10-CM

## 2020-01-26 DIAGNOSIS — Z6841 Body Mass Index (BMI) 40.0 and over, adult: Secondary | ICD-10-CM | POA: Diagnosis not present

## 2020-01-26 DIAGNOSIS — Z124 Encounter for screening for malignant neoplasm of cervix: Secondary | ICD-10-CM | POA: Insufficient documentation

## 2020-01-26 DIAGNOSIS — Z1231 Encounter for screening mammogram for malignant neoplasm of breast: Secondary | ICD-10-CM

## 2020-01-26 NOTE — Patient Instructions (Signed)

## 2020-01-26 NOTE — Progress Notes (Signed)
59 y.o. G2P0020 Married Asian Not Hispanic or Latino female here for annual exam.   Not sexually active secondary to ED.   Last HgbA1C was 7.6. She is working on exercise and portion control.   When she wakes up in the night or first in the am she has some urinary hesitancy. Ultimately feels empty. Voiding during the day is fine. Some urgency to void. Negative urine sample with her primary.     Patient's last menstrual period was 06/09/2012.          Sexually active: No.  The current method of family planning is post menopausal status.    Exercising: Yes  The patient does not participate in regular exercise at present. Smoker:  no  Health Maintenance: Pap:  09/17/15 Neg. HR HPV:neg              11/19/12 Neg. HR HPV:neg History of abnormal Pap:  no MMG:  02/22/2019 Density B  Bi-rads 1 neg  BMD:   07/12/09 Normal  Colonoscopy:6/17/11Normal f/u 10 yreas Scheduled for 03/21/20 TDaP:  2019 Gardasil: n/a   reports that she has quit smoking. She has never used smokeless tobacco. She reports current alcohol use. She reports that she does not use drugs. Rare ETOH. 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   Past Medical History:  Diagnosis Date  . Allergic rhinitis   . Anemia   . Anxiety   . DM (diabetes mellitus) ([place])   . Esophageal stricture   . Fundic gland polyps of stomach, benign   . GERD (gastroesophageal reflux disease)   . Hepatic steatosis   . Hiatal hernia   . Hypothyroid   . Kidney stones   . Panic disorder   . Sleep apnea    test negative, does not use anything    Past Surgical History:  Procedure Laterality Date  . CHOLECYSTECTOMY    . DILATION AND CURETTAGE OF UTERUS    . sinsus surgery  1996-1997   sinsus drainage   . TONSILLECTOMY      Current Outpatient Medications  Medication Sig Dispense Refill  . aspirin 81 MG tablet Take 81 mg by mouth daily.      . cetirizine (ZYRTEC) 10 MG tablet Take 1 tablet (10 mg total) by mouth daily. 90  tablet 3  . Cholecalciferol (VITAMIN D3) 2000 UNITS capsule Take 2,000 Units by mouth daily.      . fluticasone (FLONASE) 50 MCG/ACT nasal spray Place 2 sprays into the nose every morning. NEED REFILLS    . levothyroxine (SYNTHROID, LEVOTHROID) 75 MCG tablet Take 75 mcg by mouth daily.  1  . metFORMIN (GLUCOPHAGE-XR) 500 MG 24 hr tablet Take 1,000 mg by mouth in the morning and at bedtime.     . montelukast (SINGULAIR) 10 MG tablet Take one tablet by mouth daily. 30 tablet 2  . omeprazole (PRILOSEC OTC) 20 MG tablet Take 20 mg by mouth daily.    Marland Kitchen omeprazole (PRILOSEC) 40 MG capsule Take 1 capsule (40 mg total) by mouth daily. 30 capsule 11  . PROAIR HFA 108 (90 BASE) MCG/ACT inhaler INHALE 2 PUFFS 4 TIMES A DAY AS NEEDED...NEEDS OFFICE VISIT FOR MORE (Patient taking differently: INHALE 2 PUFFS 4 TIMES A DAY AS NEEDED SOB) 8.5 each 1  . rosuvastatin (CRESTOR) 5 MG tablet     . Sodium Sulfate-Mag Sulfate-KCl (SUTAB) [phone] MG TABS BIN: [id number] PCN: CN GROUP: [id number] MEMBER ID: [id number];ENI AS CASH;NO PRIOR AUTHORIZATION 24 tablet 0  .  sucralfate (CARAFATE) 1 g tablet Take 1 tablet (1 g total) by mouth 4 (four) times daily -  with meals and at bedtime. 120 tablet 3   No current facility-administered medications for this visit.    Family History  Problem Relation Age of Onset  . Diabetes Sister   . Diabetes Mother   . Stomach cancer Brother        drinker  . Diabetes Brother   . Arthritis Other        through out the family  . Diabetes Sister   . Colon cancer Neg Hx     Review of Systems  All other systems reviewed and are negative.   Exam:   BP 122/64   Pulse 91   Ht 4\' 10"  (1.473 m)   Wt 198 lb (89.8 kg)   LMP 06/09/2012   SpO2 98%   BMI 41.38 kg/m   Weight change: @WEIGHTCHANGE @ Height:   Height: 4\' 10"  (147.3 cm)  Ht Readings from Last 3 Encounters:  01/26/20 4\' 10"  (1.473 m)  12/02/19 4\' 10"  (1.473 m)  01/21/19 4\' 10"  (1.473 m)    General appearance: alert,  cooperative and appears stated age Head: Normocephalic, without obvious abnormality, atraumatic Neck: no adenopathy, supple, symmetrical, trachea midline and thyroid normal to inspection and palpation Lungs: clear to auscultation bilaterally Cardiovascular: regular rate and rhythm Breasts: normal appearance, no masses or tenderness Abdomen: soft, non-tender; non distended,  no masses,  no organomegaly Extremities: extremities normal, atraumatic, no cyanosis or edema Skin: Skin color, texture, turgor normal. No rashes or lesions Lymph nodes: Cervical, supraclavicular, and axillary nodes normal. No abnormal inguinal nodes palpated Neurologic: Grossly normal   Pelvic: External genitalia:  no lesions              Urethra:  normal appearing urethra with no masses, tenderness or lesions              Bartholins and Skenes: normal                 Vagina: normal appearing vagina with normal color and discharge, no lesions              Cervix: no lesions               Bimanual Exam:  Uterus:  no masses or tenderness              Adnexa: no mass, fullness, tenderness               Rectovaginal: Confirms               Anus:  normal sphincter tone, no lesions  Shanon Petty chaperoned for the exam.  A:  Well Woman with normal exam  BMI 41  Primary manages her medical issues  P:   Pap with hpv  Mammogram next month  Colonoscopy scheduled  Discussed breast self exam  Discussed calcium and vit D intake

## 2020-01-30 LAB — CYTOLOGY - PAP
Comment: NEGATIVE
Diagnosis: NEGATIVE
High risk HPV: NEGATIVE

## 2020-02-23 ENCOUNTER — Other Ambulatory Visit: Payer: Self-pay

## 2020-02-23 ENCOUNTER — Ambulatory Visit
Admission: RE | Admit: 2020-02-23 | Discharge: 2020-02-23 | Disposition: A | Discharge status: Home / Self Care | Payer: BC Managed Care – PPO | Source: Ambulatory Visit | Attending: Family Medicine | Admitting: Family Medicine

## 2020-02-23 DIAGNOSIS — Z1231 Encounter for screening mammogram for malignant neoplasm of breast: Secondary | ICD-10-CM

## 2020-03-08 ENCOUNTER — Encounter: Payer: Self-pay | Admitting: Gastroenterology

## 2020-03-08 ENCOUNTER — Other Ambulatory Visit: Payer: Self-pay

## 2020-03-08 ENCOUNTER — Ambulatory Visit (AMBULATORY_SURGERY_CENTER): Payer: Self-pay

## 2020-03-08 VITALS — Ht <= 58 in | Wt 197.0 lb

## 2020-03-08 DIAGNOSIS — R1310 Dysphagia, unspecified: Secondary | ICD-10-CM

## 2020-03-08 DIAGNOSIS — Z1211 Encounter for screening for malignant neoplasm of colon: Secondary | ICD-10-CM

## 2020-03-08 DIAGNOSIS — K219 Gastro-esophageal reflux disease without esophagitis: Secondary | ICD-10-CM

## 2020-03-08 NOTE — Progress Notes (Signed)
No egg or soy allergy known to patient  No issues with past sedation with any surgeries or procedures No intubation problems in the past  No FH of Malignant Hyperthermia No diet pills per patient No home 02 use per patient  No blood thinners per patient  Pt denies issues with constipation  No A fib or A flutter  EMMI video via MyChart  COVID 19 guidelines implemented in [facility] today with Pt and RN  Coupon given to pt in [facility] today , Code to Pharmacy  COVID vaccines completed on 08/2019 per pt;  Due to the COVID-19 pandemic we are asking patients to follow these guidelines. Please only bring one care partner. Please be aware that your care partner may wait in the car in the parking lot or if they feel like they will be too hot to wait in the car, they may wait in the lobby on the 4th floor. All care partners are required to wear a mask the entire time (we do not have any that we can provide them), they need to practice social distancing, and we will do a Covid check for all patient's and care partners when you arrive. Also we will check their temperature and your temperature. If the care partner waits in their car they need to stay in the parking lot the entire time and we will call them on their cell phone when the patient is ready for discharge so they can bring the car to the front of the building. Also all patient's will need to wear a mask into building.  

## 2020-03-20 ENCOUNTER — Telehealth: Payer: Self-pay | Admitting: Gastroenterology

## 2020-03-20 NOTE — Telephone Encounter (Signed)
Pt called with N/V then with persistent dry heaves shortly after taking SUTAB about 6 pm. Vomiting, dry heaves have resolved but nausea persist. She feels anything else on her stomach now or early tomorrow will cause more N/V. Offered an alternative prep tonight, offered Zofran now and before next prep dose, offered to have EGD only tomorrow, offered Zofran tonight without any more prep. She prefers to cancel both procedures for tomorrow, declines Zofran and wants to discuss options with Dr. Silverio Decamp such as Cologuard or another bowel prep with Zofran before doses.

## 2020-03-21 ENCOUNTER — Encounter: Payer: BC Managed Care – PPO | Admitting: Gastroenterology

## 2020-03-21 NOTE — Telephone Encounter (Signed)
Thank you Norberto Sorenson. Beth, can you please check how she is doing and get her in for office visit to discuss how she wants to proceed. Thanks

## 2020-03-23 NOTE — Progress Notes (Signed)
Office Visit Note  Patient: Wendy Curtis             Date of Birth: 1960/10/09           MRN: [id number]             PCP: Lujean Amel, MD Referring: Harriett Sine, MD Visit Date: 04/06/2020 Occupation: @GUAROCC @  Subjective:  Pain in multiple joints.   History of Present Illness: Wendy Curtis is a 59 y.o. female seen in consultation per request of Dr. Elvera Lennox.  According to patient she developed psoriasis when she was in her early 61s.  It was localized to her scalp and she was seen by dermatologist.  She states that her psoriasis resolved after 1 year.  She has had episodes of eczema since then.  She has had knee joint discomfort since she was in her 37s and 30s.  The symptoms got worse when she was in her 53s.  She states she has difficulty squatting.  She also had some discomfort after prolonged walking.  She recalls seeing an orthopedic surgeon who said that she may have a torn meniscus but she did not require surgery.  She states in 2014 she went to [facility] where she was evaluated by rheumatologist and he diagnosed her with osteoarthritis.  She states she also has pain in her hands and has difficulty gripping objects.  She has pain in her both shoulders especially at nighttime when she sleeps on her side.  She has been experiencing some stiffness in her lower back which she describes over SI joints.  There is no history of Achilles tendinitis or plantar fasciitis.  She was seen by [facility] rheumatology recently and had x-rays and labs.  She was not given any diagnosis.  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  She was also seen by an ENT.  She recently has noticed some dark discoloration in her nails and also some pitting in her nails.  She was seen by Dr. Elvera Lennox who felt that her nail changes could be consistent with psoriasis.  There is positive family  history of psoriasis in her knees and her sisters.  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  Activities of Daily Living:  Patient reports morning stiffness for 30-60  minutes.   Patient Denies nocturnal pain.  Difficulty dressing/grooming: Denies Difficulty climbing stairs: Denies Difficulty getting out of chair: Denies Difficulty using hands for taps, buttons, cutlery, and/or writing: Reports  Review of Systems  Constitutional: Negative for fatigue.  HENT: Negative for mouth sores, mouth dryness and nose dryness.   Eyes: Positive for itching and dryness.  Respiratory: Negative for shortness of breath and difficulty breathing.   Cardiovascular: Negative for chest pain and palpitations.  Gastrointestinal: Negative for constipation and diarrhea.  Endocrine: Positive for increased urination.  Genitourinary: Negative for difficulty urinating.  Musculoskeletal: Positive for arthralgias, joint pain, joint swelling, myalgias, morning stiffness, muscle tenderness and myalgias.  Skin: Negative for color change, rash and redness.  Allergic/Immunologic: Negative for susceptible to infections.  Neurological: Positive for headaches. Negative for dizziness, memory loss and weakness.  Hematological: Negative for bruising/bleeding tendency.  Psychiatric/Behavioral: Negative for confusion. The patient is nervous/anxious.     PMFS History:  Patient Active Problem List   Diagnosis Date Noted  . Lichen planus 10/62/6948  . Osteoarthritis of both knees 05/13/2013  . 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 BMI 40.0-44.9, adult ([place]) 09/12/2011  . Type II or unspecified type diabetes mellitus without mention of complication, uncontrolled 09/12/2011  . Allergic rhinitis 09/12/2011  . Asthma 09/12/2011  . Chronic urticaria 09/12/2011  . Nephrolithiasis 09/12/2011  . ANXIETY 08/27/2009  . PANIC DISORDER 08/27/2009  . DEPRESSION 08/27/2009  . ESOPHAGEAL STRICTURE 08/27/2009    . GERD 08/27/2009  . HIATAL HERNIA 08/27/2009  . SLEEP APNEA 08/27/2009    Past Medical History:  Diagnosis Date  . Allergic rhinitis   . Allergy    seasonal allergies  . Anemia    hx of  . Anxiety    hx of   . Arthritis   . Asthma    started in 20's  . Depression    hx of  . DM (diabetes mellitus) ([place])    on meds  . Esophageal stricture   . Fundic gland polyps of stomach, benign   . GERD (gastroesophageal reflux disease)    on meds  . Hepatic steatosis   . Hiatal hernia   . Hyperlipidemia    on meds  . Hypothyroid    on meds  . Kidney stones    hx of kidney stones  . Osteoarthritis   . Panic disorder    when traveling  . Psoriasis    per patient     Family History  Problem Relation Age of Onset  . Rheum arthritis Sister   . Diabetes Mother   . Stomach cancer Brother        drinker  . Alcohol abuse Brother   . Diabetes Brother   . Alcohol abuse Brother   . Arthritis Other        through out the family  . Diabetes Sister   . Heart attack Sister   . Arthritis Sister   . Rheum arthritis Sister   . Heart attack Sister 41  . Rheum arthritis Niece   . Colon cancer Neg Hx   . Colon polyps Neg Hx   . Esophageal cancer Neg Hx   . Rectal cancer Neg Hx    Past Surgical History:  Procedure Laterality Date  . CHOLECYSTECTOMY    . COLONOSCOPY  2011   DB-normal   . DILATION AND CURETTAGE OF UTERUS    . sinsus surgery  1996-1997   sinsus drainage   . TONSILLECTOMY     Social History   Social History Narrative  . Not on file   Immunization History  Administered Date(s) Administered  . PFIZER SARS-COV-2 Vaccination 08/06/2019, 08/27/2019     Objective: Vital Signs: BP 137/86 (BP Location: Right Arm, Patient Position: Sitting, Cuff Size: Normal)   Pulse 80   Resp 15   Ht 4\' 10"  (1.473 m)   Wt 196 lb 9.6 oz (89.2 kg)   LMP 06/09/2012   BMI 41.09 kg/m    Physical Exam Vitals and nursing note reviewed.  Constitutional:      Appearance: She is  well-developed.  HENT:     Head: Normocephalic and atraumatic.  Eyes:     Conjunctiva/sclera: Conjunctivae normal.  Cardiovascular:     Rate and Rhythm: Normal rate and regular rhythm.     Heart sounds: Normal heart sounds.  Pulmonary:     Effort: Pulmonary effort is normal.     Breath sounds: Normal breath sounds.  Abdominal:     General: Bowel sounds are normal.     Palpations: Abdomen is soft.  Musculoskeletal:  Cervical back: Normal range of motion.  Lymphadenopathy:     Cervical: No cervical adenopathy.  Skin:    General: Skin is warm and dry.     Capillary Refill: Capillary refill takes less than 2 seconds.     Comments: Nail pitting and nail dystrophy noted in her right middle finger and nail pitting noted in her right little finger  Neurological:     Mental Status: She is alert and oriented to person, place, and time.  Psychiatric:        Behavior: Behavior normal.      Musculoskeletal Exam: C-spine thoracic and lumbar spine were in good range of motion.  She had no SI joint tenderness.  Shoulder joints, elbow joints, wrist joints, MCPs PIPs and DIPs with good range of motion.  She has mild DIP thickening but no synovitis was noted.  Hip joints, knee joints, ankles, MTPs and PIPs with good range of motion with no synovitis.  CDAI Exam: CDAI Score: -- Patient Global: --; Provider Global: -- Swollen: --; Tender: -- Joint Exam 04/06/2020   No joint exam has been documented for this visit   There is currently no information documented on the homunculus. Go to the Rheumatology activity and complete the homunculus joint exam.  Investigation: No additional findings.  Imaging: No results found.  Recent Labs: Lab Results  Component Value Date   WBC 9.4 10/22/2015   HGB 13.9 10/22/2015   PLT 306.0 10/22/2015   NA 137 10/22/2015   K 4.2 10/22/2015   CL 100 10/22/2015   CO2 29 10/22/2015   GLUCOSE 145 (H) 10/22/2015   BUN 15 10/22/2015   CREATININE 0.55  10/22/2015   BILITOT 0.9 04/14/2015   ALKPHOS 84 04/14/2015   AST 28 04/14/2015   ALT 30 04/14/2015   PROT 7.6 04/14/2015   ALBUMIN 3.8 04/14/2015   CALCIUM 9.9 10/22/2015   GFRAA >60 04/14/2015    Speciality Comments: No specialty comments available.  Procedures:  No procedures performed Allergies: Celebrex [celecoxib], Meloxicam, Procaine hcl, and Penicillins   Assessment / Plan:     Visit Diagnoses: Pain in both hands -patient complains of pain and discomfort in her hands for many years.  She was evaluated in 2014 at [facility] and was given the diagnosis of osteoarthritis.  She was also evaluated in [facility] rheumatology and no diagnosis was given.  She had no synovitis on my examination.  Mild DIP prominence was noted.  Plan: XR Hand 2 View Right, XR Hand 2 View Left.  X-ray findings were consistent with osteoarthritis.  Handout on hand exercises was given.  Joint protection muscle strengthening was discussed.  Chronic SI joint pain -she complains of a stiffness in her lower back in the morning.  No SI joint tenderness was noted.  Plan: XR Pelvis 1-2 Views bilateral SI joint sclerosis on sacral and iliac side was noted consistent with osteoarthritis.  Primary osteoarthritis of both knees-she states she has been diagnosed with osteoarthritis by orthopedic surgeons in the past.  A handout on knee exercises was given.  Pain in both feet -she complains of discomfort in her feet but no synovitis was noted.  There is no history of Achilles tendinitis or plantar fasciitis.  Plan: XR Foot 2 Views Right, XR Foot 2 Views Left.  X-rays are consistent with osteoarthritis and calcaneal spurs.  Proper fitting shoes were discussed.  Neck pain-she has some stiffness in her cervical spine.  She had good range of motion.  Have given  her a handout on neck exercises.  Psoriasis - Pt was evaluated at [facility].  She has nail dystrophy and nail pitting.  She was also  diagnosed with psoriasis at age 80.  She states her symptoms lasted only for 1 year.  Chronic urticaria  History of type 2 diabetes mellitus  Anxiety and depression  Esophageal stricture  History of gastroesophageal reflux (GERD)  Hiatal hernia  History of asthma  Nephrolithiasis  History of sleep apnea  Lichen planus-patient states this diagnosis was denied by her dermatologist.  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 XR Hand 2 View Right  . XR Hand 2 View Left  . XR Foot 2 Views Right  . XR Foot 2 Views Left  . XR Pelvis 1-2 Views   No orders of the defined types were placed in this encounter.     Follow-Up Instructions: Return for Polyarthralgia.   Bo Merino, MD  Note - This record has been created using Editor, commissioning.  Chart creation errors have been sought, but may not always  have been located. Such creation errors do not reflect on  the standard of medical care.

## 2020-03-26 NOTE — Telephone Encounter (Signed)
Called the patient. No answer. Left her a message offering an appointment with the provider or rescheduling and trying a different prep.

## 2020-04-06 ENCOUNTER — Ambulatory Visit: Payer: BC Managed Care – PPO | Admitting: Rheumatology

## 2020-04-06 ENCOUNTER — Ambulatory Visit: Payer: Self-pay

## 2020-04-06 ENCOUNTER — Encounter: Payer: Self-pay | Admitting: Rheumatology

## 2020-04-06 ENCOUNTER — Other Ambulatory Visit: Payer: Self-pay

## 2020-04-06 VITALS — BP 137/86 | HR 80 | Resp 15 | Ht <= 58 in | Wt 196.6 lb

## 2020-04-06 DIAGNOSIS — Z872 Personal history of diseases of the skin and subcutaneous tissue: Secondary | ICD-10-CM

## 2020-04-06 DIAGNOSIS — M79642 Pain in left hand: Secondary | ICD-10-CM | POA: Diagnosis not present

## 2020-04-06 DIAGNOSIS — Z8719 Personal history of other diseases of the digestive system: Secondary | ICD-10-CM

## 2020-04-06 DIAGNOSIS — K222 Esophageal obstruction: Secondary | ICD-10-CM

## 2020-04-06 DIAGNOSIS — M170 Bilateral primary osteoarthritis of knee: Secondary | ICD-10-CM | POA: Diagnosis not present

## 2020-04-06 DIAGNOSIS — L508 Other urticaria: Secondary | ICD-10-CM

## 2020-04-06 DIAGNOSIS — L439 Lichen planus, unspecified: Secondary | ICD-10-CM

## 2020-04-06 DIAGNOSIS — M79671 Pain in right foot: Secondary | ICD-10-CM | POA: Diagnosis not present

## 2020-04-06 DIAGNOSIS — Z840 Family history of diseases of the skin and subcutaneous tissue: Secondary | ICD-10-CM

## 2020-04-06 DIAGNOSIS — N200 Calculus of kidney: Secondary | ICD-10-CM

## 2020-04-06 DIAGNOSIS — M79672 Pain in left foot: Secondary | ICD-10-CM | POA: Diagnosis not present

## 2020-04-06 DIAGNOSIS — M79641 Pain in right hand: Secondary | ICD-10-CM

## 2020-04-06 DIAGNOSIS — Z8639 Personal history of other endocrine, nutritional and metabolic disease: Secondary | ICD-10-CM

## 2020-04-06 DIAGNOSIS — M533 Sacrococcygeal disorders, not elsewhere classified: Secondary | ICD-10-CM

## 2020-04-06 DIAGNOSIS — D568 Other thalassemias: Secondary | ICD-10-CM

## 2020-04-06 DIAGNOSIS — Z8709 Personal history of other diseases of the respiratory system: Secondary | ICD-10-CM

## 2020-04-06 DIAGNOSIS — L409 Psoriasis, unspecified: Secondary | ICD-10-CM

## 2020-04-06 DIAGNOSIS — K449 Diaphragmatic hernia without obstruction or gangrene: Secondary | ICD-10-CM

## 2020-04-06 DIAGNOSIS — M542 Cervicalgia: Secondary | ICD-10-CM

## 2020-04-06 DIAGNOSIS — Z8669 Personal history of other diseases of the nervous system and sense organs: Secondary | ICD-10-CM

## 2020-04-06 DIAGNOSIS — F419 Anxiety disorder, unspecified: Secondary | ICD-10-CM

## 2020-04-06 DIAGNOSIS — L4050 Arthropathic psoriasis, unspecified: Secondary | ICD-10-CM

## 2020-04-06 DIAGNOSIS — F32A Depression, unspecified: Secondary | ICD-10-CM

## 2020-04-06 DIAGNOSIS — G8929 Other chronic pain: Secondary | ICD-10-CM

## 2020-04-06 NOTE — Patient Instructions (Signed)
Journal for Nurse Practitioners, 15(4), 263-267. Retrieved March 15, 2018 from [email or web address]">  Knee Exercises Ask your health care provider which exercises are safe for you. Do exercises exactly as told by your health care provider and adjust them as directed. It is normal to feel mild stretching, pulling, tightness, or discomfort as you do these exercises. Stop right away if you feel sudden pain or your pain gets worse. Do not begin these exercises until told by your health care provider. Stretching and range-of-motion exercises These exercises warm up your muscles and joints and improve the movement and flexibility of your knee. These exercises also help to relieve pain and swelling. Knee extension, prone 1. Lie on your abdomen (prone position) on a bed. 2. Place your left / right knee just beyond the edge of the surface so your knee is not on the bed. You can put a towel under your left / right thigh just above your kneecap for comfort. 3. Relax your leg muscles and allow gravity to straighten your knee (extension). You should feel a stretch behind your left / right knee. 4. Hold this position for __________ seconds. 5. Scoot up so your knee is supported between repetitions. Repeat __________ times. Complete this exercise __________ times a day. Knee flexion, active  1. Lie on your back with both legs straight. If this causes back discomfort, bend your left / right knee so your foot is flat on the floor. 2. Slowly slide your left / right heel back toward your buttocks. Stop when you feel a gentle stretch in the front of your knee or thigh (flexion). 3. Hold this position for __________ seconds. 4. Slowly slide your left / right heel back to the starting position. Repeat __________ times. Complete this exercise __________ times a day. Quadriceps stretch, prone  1. Lie on your abdomen on a firm surface, such as a bed or padded floor. 2. Bend your left / right knee and hold  your ankle. If you cannot reach your ankle or pant leg, loop a belt around your foot and grab the belt instead. 3. Gently pull your heel toward your buttocks. Your knee should not slide out to the side. You should feel a stretch in the front of your thigh and knee (quadriceps). 4. Hold this position for __________ seconds. Repeat __________ times. Complete this exercise __________ times a day. Hamstring, supine 1. Lie on your back (supine position). 2. Loop a belt or towel over the ball of your left / right foot. The ball of your foot is on the walking surface, right under your toes. 3. Straighten your left / right knee and slowly pull on the belt to raise your leg until you feel a gentle stretch behind your knee (hamstring). ? Do not let your knee bend while you do this. ? Keep your other leg flat on the floor. 4. Hold this position for __________ seconds. Repeat __________ times. Complete this exercise __________ times a day. Strengthening exercises These exercises build strength and endurance in your knee. Endurance is the ability to use your muscles for a long time, even after they get tired. Quadriceps, isometric This exercise stretches the muscles in front of your thigh (quadriceps) without moving your knee joint (isometric). 1. Lie on your back with your left / right leg extended and your other knee bent. Put a rolled towel or small pillow under your knee if told by your health care provider. 2. Slowly tense the muscles in the front of your left /   right thigh. You should see your kneecap slide up toward your hip or see increased dimpling just above the knee. This motion will push the back of the knee toward the floor. 3. For __________ seconds, hold the muscle as tight as you can without increasing your pain. 4. Relax the muscles slowly and completely. Repeat __________ times. Complete this exercise __________ times a day. Straight leg raises This exercise stretches the muscles in front  of your thigh (quadriceps) and the muscles that move your hips (hip flexors). 1. Lie on your back with your left / right leg extended and your other knee bent. 2. Tense the muscles in the front of your left / right thigh. You should see your kneecap slide up or see increased dimpling just above the knee. Your thigh may even shake a bit. 3. Keep these muscles tight as you raise your leg 4-6 inches (10-15 cm) off the floor. Do not let your knee bend. 4. Hold this position for __________ seconds. 5. Keep these muscles tense as you lower your leg. 6. Relax your muscles slowly and completely after each repetition. Repeat __________ times. Complete this exercise __________ times a day. Hamstring, isometric 1. Lie on your back on a firm surface. 2. Bend your left / right knee about __________ degrees. 3. Dig your left / right heel into the surface as if you are trying to pull it toward your buttocks. Tighten the muscles in the back of your thighs (hamstring) to "dig" as hard as you can without increasing any pain. 4. Hold this position for __________ seconds. 5. Release the tension gradually and allow your muscles to relax completely for __________ seconds after each repetition. Repeat __________ times. Complete this exercise __________ times a day. Hamstring curls If told by your health care provider, do this exercise while wearing ankle weights. Begin with __________ lb weights. Then increase the weight by 1 lb (0.5 kg) increments. Do not wear ankle weights that are more than __________ lb. 1. Lie on your abdomen with your legs straight. 2. Bend your left / right knee as far as you can without feeling pain. Keep your hips flat against the floor. 3. Hold this position for __________ seconds. 4. Slowly lower your leg to the starting position. Repeat __________ times. Complete this exercise __________ times a day. Squats This exercise strengthens the muscles in front of your thigh and knee  (quadriceps). 1. Stand in front of a table, with your feet and knees pointing straight ahead. You may rest your hands on the table for balance but not for support. 2. Slowly bend your knees and lower your hips like you are going to sit in a chair. ? Keep your weight over your heels, not over your toes. ? Keep your lower legs upright so they are parallel with the table legs. ? Do not let your hips go lower than your knees. ? Do not bend lower than told by your health care provider. ? If your knee pain increases, do not bend as low. 3. Hold the squat position for __________ seconds. 4. Slowly push with your legs to return to standing. Do not use your hands to pull yourself to standing. Repeat __________ times. Complete this exercise __________ times a day. Wall slides This exercise strengthens the muscles in front of your thigh and knee (quadriceps). 1. Lean your back against a smooth wall or door, and walk your feet out 18-24 inches (46-61 cm) from it. 2. Place your feet hip-width apart. 3.   Slowly slide down the wall or door until your knees bend __________ degrees. Keep your knees over your heels, not over your toes. Keep your knees in line with your hips. 4. Hold this position for __________ seconds. Repeat __________ times. Complete this exercise __________ times a day. Straight leg raises This exercise strengthens the muscles that rotate the leg at the hip and move it away from your body (hip abductors). 1. Lie on your side with your left / right leg in the top position. Lie so your head, shoulder, knee, and hip line up. You may bend your bottom knee to help you keep your balance. 2. Roll your hips slightly forward so your hips are stacked directly over each other and your left / right knee is facing forward. 3. Leading with your heel, lift your top leg 4-6 inches (10-15 cm). You should feel the muscles in your outer hip lifting. ? Do not let your foot drift forward. ? Do not let your knee  roll toward the ceiling. 4. Hold this position for __________ seconds. 5. Slowly return your leg to the starting position. 6. Let your muscles relax completely after each repetition. Repeat __________ times. Complete this exercise __________ times a day. Straight leg raises This exercise stretches the muscles that move your hips away from the front of the pelvis (hip extensors). 1. Lie on your abdomen on a firm surface. You can put a pillow under your hips if that is more comfortable. 2. Tense the muscles in your buttocks and lift your left / right leg about 4-6 inches (10-15 cm). Keep your knee straight as you lift your leg. 3. Hold this position for __________ seconds. 4. Slowly lower your leg to the starting position. 5. Let your leg relax completely after each repetition. Repeat __________ times. Complete this exercise __________ times a day. This information is not intended to replace advice given to you by your health care provider. Make sure you discuss any questions you have with your health care provider. Document Revised: 03/16/2018 Document Reviewed: 03/16/2018 [employer] Patient Education  2020 [employer] Hand Exercises Hand exercises can be helpful for almost anyone. These exercises can strengthen the hands, improve flexibility and movement, and increase blood flow to the hands. These results can make work and daily tasks easier. Hand exercises can be especially helpful for people who have joint pain from arthritis or have nerve damage from overuse (carpal tunnel syndrome). These exercises can also help people who have injured a hand. Exercises Most of these hand exercises are gentle stretching and motion exercises. It is usually safe to do them often throughout the day. Warming up your hands before exercise may help to reduce stiffness. You can do this with gentle massage or by placing your hands in warm water for 10-15 minutes. It is normal to feel some stretching, pulling,  tightness, or mild discomfort as you begin new exercises. This will gradually improve. Stop an exercise right away if you feel sudden, severe pain or your pain gets worse. Ask your health care provider which exercises are best for you. Knuckle bend or "claw" fist 1. Stand or sit with your arm, hand, and all five fingers pointed straight up. Make sure to keep your wrist straight during the exercise. 2. Gently bend your fingers down toward your palm until the tips of your fingers are touching the top of your palm. Keep your big knuckle straight and just bend the small knuckles in your fingers. 3. Hold this position for   __________ seconds. 4. Straighten (extend) your fingers back to the starting position. Repeat this exercise 5-10 times with each hand. Full finger fist 1. Stand or sit with your arm, hand, and all five fingers pointed straight up. Make sure to keep your wrist straight during the exercise. 2. Gently bend your fingers into your palm until the tips of your fingers are touching the middle of your palm. 3. Hold this position for __________ seconds. 4. Extend your fingers back to the starting position, stretching every joint fully. Repeat this exercise 5-10 times with each hand. Straight fist 1. Stand or sit with your arm, hand, and all five fingers pointed straight up. Make sure to keep your wrist straight during the exercise. 2. Gently bend your fingers at the big knuckle, where your fingers meet your hand, and the middle knuckle. Keep the knuckle at the tips of your fingers straight and try to touch the bottom of your palm. 3. Hold this position for __________ seconds. 4. Extend your fingers back to the starting position, stretching every joint fully. Repeat this exercise 5-10 times with each hand. Tabletop 1. Stand or sit with your arm, hand, and all five fingers pointed straight up. Make sure to keep your wrist straight during the exercise. 2. Gently bend your fingers at the big  knuckle, where your fingers meet your hand, as far down as you can while keeping the small knuckles in your fingers straight. Think of forming a tabletop with your fingers. 3. Hold this position for __________ seconds. 4. Extend your fingers back to the starting position, stretching every joint fully. Repeat this exercise 5-10 times with each hand. Finger spread 1. Place your hand flat on a table with your palm facing down. Make sure your wrist stays straight as you do this exercise. 2. Spread your fingers and thumb apart from each other as far as you can until you feel a gentle stretch. Hold this position for __________ seconds. 3. Bring your fingers and thumb tight together again. Hold this position for __________ seconds. Repeat this exercise 5-10 times with each hand. Making circles 1. Stand or sit with your arm, hand, and all five fingers pointed straight up. Make sure to keep your wrist straight during the exercise. 2. Make a circle by touching the tip of your thumb to the tip of your index finger. 3. Hold for __________ seconds. Then open your hand wide. 4. Repeat this motion with your thumb and each finger on your hand. Repeat this exercise 5-10 times with each hand. Thumb motion 1. Sit with your forearm resting on a table and your wrist straight. Your thumb should be facing up toward the ceiling. Keep your fingers relaxed as you move your thumb. 2. Lift your thumb up as high as you can toward the ceiling. Hold for __________ seconds. 3. Bend your thumb across your palm as far as you can, reaching the tip of your thumb for the small finger (pinkie) side of your palm. Hold for __________ seconds. Repeat this exercise 5-10 times with each hand. Grip strengthening  1. Hold a stress ball or other soft ball in the middle of your hand. 2. Slowly increase the pressure, squeezing the ball as much as you can without causing pain. Think of bringing the tips of your fingers into the middle of  your palm. All of your finger joints should bend when doing this exercise. 3. Hold your squeeze for __________ seconds, then relax. Repeat this exercise 5-10 times with each   hand. Contact a health care provider if:  Your hand pain or discomfort gets much worse when you do an exercise.  Your hand pain or discomfort does not improve within 2 hours after you exercise. If you have any of these problems, stop doing these exercises right away. Do not do them again unless your health care provider says that you can. Get help right away if:  You develop sudden, severe hand pain or swelling. If this happens, stop doing these exercises right away. Do not do them again unless your health care provider says that you can. This information is not intended to replace advice given to you by your health care provider. Make sure you discuss any questions you have with your health care provider. Document Revised: 09/16/2018 Document Reviewed: 05/27/2018 Elsevier Patient Education  [place]. Cervical Strain and Sprain Rehab Ask your health care provider which exercises are safe for you. Do exercises exactly as told by your health care provider and adjust them as directed. It is normal to feel mild stretching, pulling, tightness, or discomfort as you do these exercises. Stop right away if you feel sudden pain or your pain gets worse. Do not begin these exercises until told by your health care provider. Stretching and range-of-motion exercises Cervical side bending  6. Using good posture, sit on a stable chair or stand up. 7. Without moving your shoulders, slowly tilt your left / right ear to your shoulder until you feel a stretch in the opposite side neck muscles. You should be looking straight ahead. 8. Hold for __________ seconds. 9. Repeat with the other side of your neck. Repeat __________ times. Complete this exercise __________ times a day. Cervical rotation  5. Using good posture, sit on a  stable chair or stand up. 6. Slowly turn your head to the side as if you are looking over your left / right shoulder. ? Keep your eyes level with the ground. ? Stop when you feel a stretch along the side and the back of your neck. 7. Hold for __________ seconds. 8. Repeat this by turning to your other side. Repeat __________ times. Complete this exercise __________ times a day. Thoracic extension and pectoral stretch 5. Roll a towel or a small blanket so it is about 4 inches (10 cm) in diameter. 6. Lie down on your back on a firm surface. 7. Put the towel lengthwise, under your spine in the middle of your back. It should not be under your shoulder blades. The towel should line up with your spine from your middle back to your lower back. 8. Put your hands behind your head and let your elbows fall out to your sides. 9. Hold for __________ seconds. Repeat __________ times. Complete this exercise __________ times a day. Strengthening exercises Isometric upper cervical flexion 5. Lie on your back with a thin pillow behind your head and a small rolled-up towel under your neck. 6. Gently tuck your chin toward your chest and nod your head down to look toward your feet. Do not lift your head off the pillow. 7. Hold for __________ seconds. 8. Release the tension slowly. Relax your neck muscles completely before you repeat this exercise. Repeat __________ times. Complete this exercise __________ times a day. Isometric cervical extension  5. Stand about 6 inches (15 cm) away from a wall, with your back facing the wall. 6. Place a soft object, about 6-8 inches (15-20 cm) in diameter, between the back of your head and the wall. A  soft object could be a small pillow, a ball, or a folded towel. 7. Gently tilt your head back and press into the soft object. Keep your jaw and forehead relaxed. 8. Hold for __________ seconds. 9. Release the tension slowly. Relax your neck muscles completely before you repeat  this exercise. Repeat __________ times. Complete this exercise __________ times a day. Posture and body mechanics Body mechanics refers to the movements and positions of your body while you do your daily activities. Posture is part of body mechanics. Good posture and healthy body mechanics can help to relieve stress in your body's tissues and joints. Good posture means that your spine is in its natural S-curve position (your spine is neutral), your shoulders are pulled back slightly, and your head is not tipped forward. The following are general guidelines for applying improved posture and body mechanics to your everyday activities. Sitting  7. When sitting, keep your spine neutral and keep your feet flat on the floor. Use a footrest, if necessary, and keep your thighs parallel to the floor. Avoid rounding your shoulders, and avoid tilting your head forward. 8. When working at a desk or a computer, keep your desk at a height where your hands are slightly lower than your elbows. Slide your chair under your desk so you are close enough to maintain good posture. 9. When working at a computer, place your monitor at a height where you are looking straight ahead and you do not have to tilt your head forward or downward to look at the screen. Standing   When standing, keep your spine neutral and keep your feet about hip-width apart. Keep a slight bend in your knees. Your ears, shoulders, and hips should line up.  When you do a task in which you stand in one place for a long time, place one foot up on a stable object that is 2-4 inches (5-10 cm) high, such as a footstool. This helps keep your spine neutral. Resting When lying down and resting, avoid positions that are most painful for you. Try to support your neck in a neutral position. You can use a contour pillow or a small rolled-up towel. Your pillow should support your neck but not push on it. This information is not intended to replace advice given to  you by your health care provider. Make sure you discuss any questions you have with your health care provider. Document Revised: 09/15/2018 Document Reviewed: 02/24/2018 Elsevier Patient Education  [place].

## 2020-04-23 ENCOUNTER — Ambulatory Visit: Payer: BC Managed Care – PPO | Admitting: Rheumatology

## 2020-06-18 ENCOUNTER — Telehealth: Payer: Self-pay | Admitting: Gastroenterology

## 2020-06-18 NOTE — Telephone Encounter (Signed)
Inbound call from patient requesting a call back from nurse please.  Needs to schedule procedure but has questions before scheduling.  Requesting to be called tomorrow morning if possible.

## 2020-06-20 NOTE — Telephone Encounter (Signed)
Called the patient back at the phone number provided. No answer. Left her a message of my return call. Apology for the delay in the return call. I have been out of the office for the past 2 days.

## 2020-06-28 NOTE — Telephone Encounter (Signed)
Spoke with the patient. She was unable to complete her prep for her previously scheduled procedures. Questions about her options, such as Cologuard or delaying the screening until next year. Agrees to an appointment to discuss her options, address her risks and create a plan of care going forward.  Also mentions a tingling sensation she has had on the tip and sides of her tongue. She has also noticed a strange taste. Wonders if this is connected to the new dose of her Omeprazole. She was previously taking Prilosec OTC twice a day. She is taking Omeprazole 40 mg daily for a couple of weeks now. Denies absence of taste or smell, No COVID symptoms. She is vaccinated and boosted. No symptoms of an allergic response. Patient reassured. She has an appointment next week. This has been present for a couple of weeks without escalation.She will address it at her appointment.

## 2020-06-28 NOTE — Telephone Encounter (Signed)
Ok thanks 

## 2020-06-28 NOTE — Telephone Encounter (Signed)
Pt is requesting a call back from a nurse (missed call) 

## 2020-07-05 ENCOUNTER — Ambulatory Visit: Payer: BC Managed Care – PPO | Admitting: Gastroenterology

## 2020-07-05 ENCOUNTER — Other Ambulatory Visit: Payer: Self-pay

## 2020-07-05 VITALS — BP 142/82 | HR 76 | Ht <= 58 in | Wt 189.5 lb

## 2020-07-05 DIAGNOSIS — K219 Gastro-esophageal reflux disease without esophagitis: Secondary | ICD-10-CM

## 2020-07-05 DIAGNOSIS — Z1211 Encounter for screening for malignant neoplasm of colon: Secondary | ICD-10-CM

## 2020-07-05 DIAGNOSIS — R1013 Epigastric pain: Secondary | ICD-10-CM

## 2020-07-05 NOTE — Patient Instructions (Signed)
Your recall colonoscopy and Endoscopy will be in June 2022  Continue Carafate and Omeprazole   [employer]'s Current Therapy 2021 (pp. 213-216). [place], [place]: Elsevier.">  Gastroesophageal Reflux Disease, Adult Gastroesophageal reflux (GER) happens when acid from the stomach flows up into the tube that connects the mouth and the stomach (esophagus). Normally, food travels down the esophagus and stays in the stomach to be digested. However, when a person has GER, food and stomach acid sometimes move back up into the esophagus. If this becomes a more serious problem, the person may be diagnosed with a disease called gastroesophageal reflux disease (GERD). GERD occurs when the reflux:  Happens often.  Causes frequent or severe symptoms.  Causes problems such as damage to the esophagus. When stomach acid comes in contact with the esophagus, the acid may cause inflammation in the esophagus. Over time, GERD may create small holes (ulcers) in the lining of the esophagus. What are the causes? This condition is caused by a problem with the muscle between the esophagus and the stomach (lower esophageal sphincter, or LES). Normally, the LES muscle closes after food passes through the esophagus to the stomach. When the LES is weakened or abnormal, it does not close properly, and that allows food and stomach acid to go back up into the esophagus. The LES can be weakened by certain dietary substances, medicines, and medical conditions, including:  Tobacco use.  Pregnancy.  Having a hiatal hernia.  Alcohol use.  Certain foods and beverages, such as coffee, chocolate, onions, and peppermint. What increases the risk? You are more likely to develop this condition if you:  Have an increased body weight.  Have a connective tissue disorder.  Take NSAIDs, such as ibuprofen. What are the signs or symptoms? Symptoms of this condition include:  Heartburn.  Difficult or painful swallowing and the feeling  of having a lump in the throat.  A bitter taste in the mouth.  Bad breath and having a large amount of saliva.  Having an upset or bloated stomach and belching.  Chest pain. Different conditions can cause chest pain. Make sure you see your health care provider if you experience chest pain.  Shortness of breath or wheezing.  Ongoing (chronic) cough or a nighttime cough.  Wearing away of tooth enamel.  Weight loss. How is this diagnosed? This condition may be diagnosed based on a medical history and a physical exam. To determine if you have mild or severe GERD, your health care provider may also monitor how you respond to treatment. You may also have tests, including:  A test to examine your stomach and esophagus with a small camera (endoscopy).  A test that measures the acidity level in your esophagus.  A test that measures how much pressure is on your esophagus.  A barium swallow or modified barium swallow test to show the shape, size, and functioning of your esophagus. How is this treated? Treatment for this condition may vary depending on how severe your symptoms are. Your health care provider may recommend:  Changes to your diet.  Medicine.  Surgery. The goal of treatment is to help relieve your symptoms and to prevent complications. Follow these instructions at home: Eating and drinking  Follow a diet as recommended by your health care provider. This may involve avoiding foods and drinks such as: ? Coffee and tea, with or without caffeine. ? Drinks that contain alcohol. ? Energy drinks and sports drinks. ? Carbonated drinks or sodas. ? Chocolate and cocoa. ? Peppermint and  mint flavorings. ? Garlic and onions. ? Horseradish. ? Spicy and acidic foods, including peppers, chili powder, curry powder, vinegar, hot sauces, and barbecue sauce. ? Citrus fruit juices and citrus fruits, such as oranges, lemons, and limes. ? Tomato-based foods, such as red sauce, chili,  salsa, and pizza with red sauce. ? Fried and fatty foods, such as donuts, french fries, potato chips, and high-fat dressings. ? High-fat meats, such as hot dogs and fatty cuts of red and white meats, such as rib eye steak, sausage, ham, and bacon. ? High-fat dairy items, such as whole milk, butter, and cream cheese.  Eat small, frequent meals instead of large meals.  Avoid drinking large amounts of liquid with your meals.  Avoid eating meals during the 2-3 hours before bedtime.  Avoid lying down right after you eat.  Do not exercise right after you eat.   Lifestyle  Do not use any products that contain nicotine or tobacco. These products include cigarettes, chewing tobacco, and vaping devices, such as e-cigarettes. If you need help quitting, ask your health care provider.  Try to reduce your stress by using methods such as yoga or meditation. If you need help reducing stress, ask your health care provider.  If you are overweight, reduce your weight to an amount that is healthy for you. Ask your health care provider for guidance about a safe weight loss goal.   General instructions  Pay attention to any changes in your symptoms.  Take over-the-counter and prescription medicines only as told by your health care provider. Do not take aspirin, ibuprofen, or other NSAIDs unless your health care provider told you to take these medicines.  Wear loose-fitting clothing. Do not wear anything tight around your waist that causes pressure on your abdomen.  Raise (elevate) the head of your bed about 6 inches (15 cm). You can use a wedge to do this.  Avoid bending over if this makes your symptoms worse.  Keep all follow-up visits. This is important. Contact a health care provider if:  You have: ? New symptoms. ? Unexplained weight loss. ? Difficulty swallowing or it hurts to swallow. ? Wheezing or a persistent cough. ? A hoarse voice.  Your symptoms do not improve with treatment. Get  help right away if:  You have sudden pain in your arms, neck, jaw, teeth, or back.  You suddenly feel sweaty, dizzy, or light-headed.  You have chest pain or shortness of breath.  You vomit and the vomit is green, yellow, or black, or it looks like blood or coffee grounds.  You faint.  You have stool that is red, bloody, or black.  You cannot swallow, drink, or eat. These symptoms may represent a serious problem that is an emergency. Do not wait to see if the symptoms will go away. Get medical help right away. Call your local emergency services (911 in the [place] Do not drive yourself to the hospital. Summary  Gastroesophageal reflux happens when acid from the stomach flows up into the esophagus. GERD is a disease in which the reflux happens often, causes frequent or severe symptoms, or causes problems such as damage to the esophagus.  Treatment for this condition may vary depending on how severe your symptoms are. Your health care provider may recommend diet and lifestyle changes, medicine, or surgery.  Contact a health care provider if you have new or worsening symptoms.  Take over-the-counter and prescription medicines only as told by your health care provider. Do not take aspirin, ibuprofen,  or other NSAIDs unless your health care provider told you to do so.  Keep all follow-up visits as told by your health care provider. This is important. This information is not intended to replace advice given to you by your health care provider. Make sure you discuss any questions you have with your health care provider. Document Revised: 12/05/2019 Document Reviewed: 12/05/2019 Elsevier Patient Education  [place].  I appreciate the  opportunity to care for you  Thank You   Harl Bowie , MD

## 2020-07-05 NOTE — Progress Notes (Signed)
Wendy Curtis    [id number]    09/16/1960  Primary Care Physician:Koirala, Dibas, MD  Referring Physician: Lujean Amel, MD [place],  [place] [address]   Chief complaint: GERD, history of colon polyps, hiatal hernia  HPI:   60 year old very pleasant female here for follow-up visit for GERD.  She recently retired from [employer]. She continues to have intermittent breakthrough heartburn, GERD symptoms, atypical chest discomfort and dysphagia.  Sucralfate has helped her symptoms to some extent.  She is using over-the-counter Prilosec because her insurance is no longer covering PPI.  She was scheduled for EGD for further evaluation of dysphagia and GERD symptoms.  She was also due for colorectal cancer screening, hence scheduled for colonoscopy at the same time.  Patient did not tolerate bowel prep and had to cancel both procedures.  EGD July 2015 showed mild esophageal stricture, status post dilation with Savary 56mm.  5 cm hiatal hernia and negative for Barrett's esophagus  Her brother had gastric cancer  She is s/p cholecystectomy  Colonoscopy June 2011 no polyps  Due for recall colonoscopy    Omeprazole causing the nausea, feels the slimy taste in the mouth Carafate made it better    Outpatient Encounter Medications as of 07/05/2020  Medication Sig  . ALPRAZolam (XANAX PO) Take 1 tablet by mouth as needed.  Marland Kitchen aspirin 81 MG tablet Take 81 mg by mouth daily.  . Cholecalciferol (VITAMIN D3) 2000 UNITS capsule Take 2,000 Units by mouth daily.  . fluticasone (FLONASE) 50 MCG/ACT nasal spray Place 2 sprays into the nose every morning. NEED REFILLS  . levocetirizine (XYZAL) 5 MG tablet Take 5 mg by mouth daily.  Marland Kitchen levothyroxine (SYNTHROID, LEVOTHROID) 75 MCG tablet Take 75 mcg by mouth daily.  . metFORMIN (GLUCOPHAGE-XR) 500 MG 24 hr tablet Take 1,000 mg by mouth in the morning and at bedtime.  . montelukast (SINGULAIR) 10 MG  tablet Take one tablet by mouth daily.  Marland Kitchen olopatadine (PATANOL) 0.1 % ophthalmic solution 1 drop 2 (two) times daily.  Marland Kitchen omeprazole (PRILOSEC) 40 MG capsule Take 1 capsule (40 mg total) by mouth daily.  Marland Kitchen PROAIR HFA 108 (90 BASE) MCG/ACT inhaler INHALE 2 PUFFS 4 TIMES A DAY AS NEEDED...NEEDS OFFICE VISIT FOR MORE  . rosuvastatin (CRESTOR) 5 MG tablet Take 5 mg by mouth daily.  . sucralfate (CARAFATE) 1 g tablet Take 1 tablet (1 g total) by mouth 4 (four) times daily -  with meals and at bedtime. (Patient taking differently: Take 1 g by mouth as needed.)  . cetirizine (ZYRTEC) 10 MG tablet Take 1 tablet (10 mg total) by mouth daily. (Patient not taking: Reported on 07/05/2020)   No facility-administered encounter medications on file as of 07/05/2020.    Allergies as of 07/05/2020 - Review Complete 07/05/2020  Allergen Reaction Noted  . Celebrex [celecoxib]  09/12/2011  . Meloxicam  09/12/2011  . Procaine hcl Other (See Comments) 08/27/2009  . Penicillins Rash 08/27/2009    Past Medical History:  Diagnosis Date  . Allergic rhinitis   . Allergy    seasonal allergies  . Anemia    hx of  . Anxiety    hx of   . Arthritis   . Asthma    started in 20's  . Depression    hx of  . DM (diabetes mellitus) ([place])    on meds  . Esophageal stricture   . Fundic gland polyps  of stomach, benign   . GERD (gastroesophageal reflux disease)    on meds  . Hepatic steatosis   . Hiatal hernia   . Hyperlipidemia    on meds  . Hypothyroid    on meds  . Kidney stones    hx of kidney stones  . Osteoarthritis   . Panic disorder    when traveling  . Psoriasis    per patient     Past Surgical History:  Procedure Laterality Date  . CHOLECYSTECTOMY    . COLONOSCOPY  2011   DB-normal   . DILATION AND CURETTAGE OF UTERUS    . sinsus surgery  1996-1997   sinsus drainage   . TONSILLECTOMY      Family History  Problem Relation Age of Onset  . Rheum arthritis Sister   . Diabetes Mother   .  Stomach cancer Brother        drinker  . Alcohol abuse Brother   . Diabetes Brother   . Alcohol abuse Brother   . Arthritis Other        through out the family  . Diabetes Sister   . Heart attack Sister   . Arthritis Sister   . Rheum arthritis Sister   . Heart attack Sister 17  . Rheum arthritis Niece   . Colon cancer Neg Hx   . Colon polyps Neg Hx   . Esophageal cancer Neg Hx   . Rectal cancer Neg Hx     Social History   Socioeconomic History  . Marital status: Married    Spouse name: Not on file  . Number of children: 0  . Years of education: Not on file  . Highest education level: Not on file  Occupational History  . Occupation: Product manager: [employer]  Tobacco Use  . Smoking status: Former Research scientist (life sciences)  . Smokeless tobacco: Never Used  Vaping Use  . Vaping Use: Never used  Substance and Sexual Activity  . Alcohol use: Yes    Alcohol/week: 0.0 - 1.0 standard drinks    Comment: occ  . Drug use: No  . Sexual activity: Not Currently    Partners: Male    Comment: husband vasectomy  Other Topics Concern  . Not on file  Social History Narrative  . Not on file   Social Determinants of Health   Financial Resource Strain: Not on file  Food Insecurity: Not on file  Transportation Needs: Not on file  Physical Activity: Not on file  Stress: Not on file  Social Connections: Not on file  Intimate Partner Violence: Not on file      Review of systems: All other review of systems negative except as mentioned in the HPI.   Physical Exam: Vitals:   07/05/20 1106  BP: (!) 142/82  Pulse: 76   Body mass index is 39.61 kg/m. Gen:      No acute distress HEENT:  sclera anicteric Abd:      soft, non-tender; no palpable masses, no distension Ext:    No edema Neuro: alert and oriented x 3 Psych: normal mood and affect  Data Reviewed:  Reviewed labs, radiology imaging, old records and pertinent past GI work up   Assessment and  Plan/Recommendations:  60 year old very pleasant female with history of chronic GERD with complaints of intermittent dysphagia and persistent symptoms despite PPI She is also due for colorectal cancer screening Discussed alternative methods including cologaurd, she wants to proceed with colonoscopy She wants to  hold off scheduling EGD and colonoscopy at this time, she wants to mentally prepare prior to rescheduling Will place a recall in the system for June 2022 for reminder, advised her to call back if she is wishing to schedule it sooner  Use omeprazole 40 mg daily Carafate 1 g before meals and at bedtime Lifestyle modification and antireflux measures  Return in 6 months or sooner if needed   The patient was provided an opportunity to ask questions and all were answered. The patient agreed with the plan and demonstrated an understanding of the instructions.  Damaris Hippo , MD    CC: Lujean Amel, MD

## 2020-07-18 NOTE — Progress Notes (Signed)
Cardiology Office Note:   Date:  07/19/2020  NAME:  Wendy Curtis    MRN: [id number] DOB:  May 27, 1961   PCP:  Lujean Amel, MD  Cardiologist:  No primary care provider on file.  Electrophysiologist:  None   Referring MD: Lujean Amel, MD   Chief Complaint  Patient presents with   family history of heart disease   History of Present Illness:   Wendy Curtis is a 60 y.o. female with a hx of DM who is being seen today for the evaluation of family history of CAD at the request of Koirala, Dibas, MD.  She presents for evaluation of family history of heart disease.  She had a sister who recently passed with a heart attack at age 11 in [place].  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  She is remained here since then.  Medical history significant for diabetes and hypothyroidism.  She also is obese.  She request evaluation about things she can do determine if she has heart disease.  She reports she does not exercise routinely due to arthritis.  She reports no chest pain or shortness of breath.  No structured exercise due to her knee arthritis.  She has no symptoms despite this.  Mild sleep apnea was detected 20 years ago.  She is not excessively tired.  She does snore.  Her blood pressure is 110/70.  Most recent LDL cholesterol 73.  A1c 7.4.  She is never had a heart attack or stroke.  She is a former smoker but smoked rarely.  She drinks alcohol moderation.  No drug use reported.  She is retired Automotive engineer.  She reports she is working on her diet.  BMI 39.  No exercise as mentioned above.  Problem List 1. DM -A1c 7.4 2. Obesity -BMI 39 3. HTN 4. HLD -Total cholesterol 138, HDL 40, LDL 73, triglycerides 135  Past Medical History: Past Medical History:  Diagnosis Date   Allergic rhinitis    Allergy    seasonal allergies   Anemia    hx of   Anxiety    hx of    Arthritis    Asthma     started in 20's   Depression    hx of   DM (diabetes mellitus) ([place])    on meds   Esophageal stricture    Fundic gland polyps of stomach, benign    GERD (gastroesophageal reflux disease)    on meds   Hepatic steatosis    Hiatal hernia    Hyperlipidemia    on meds   Hypothyroid    on meds   Kidney stones    hx of kidney stones   Osteoarthritis    Panic disorder    when traveling   Psoriasis    per patient     Past Surgical History: Past Surgical History:  Procedure Laterality Date   CHOLECYSTECTOMY     COLONOSCOPY  2011   DB-normal    DILATION AND CURETTAGE OF UTERUS     sinsus surgery  1996-1997   sinsus drainage    TONSILLECTOMY      Current Medications: Current Meds  Medication Sig   ALPRAZolam (XANAX PO) Take 1 tablet by mouth as needed.   aspirin 81 MG tablet Take 81 mg by mouth daily.   cetirizine (ZYRTEC) 10 MG tablet Take 1 tablet (10 mg total) by mouth daily.   Cholecalciferol (VITAMIN  D3) 2000 UNITS capsule Take 2,000 Units by mouth daily.   fluticasone (FLONASE) 50 MCG/ACT nasal spray Place 2 sprays into the nose every morning. NEED REFILLS   levocetirizine (XYZAL) 5 MG tablet Take 5 mg by mouth daily.   levothyroxine (SYNTHROID, LEVOTHROID) 75 MCG tablet Take 75 mcg by mouth daily.   metFORMIN (GLUCOPHAGE-XR) 500 MG 24 hr tablet Take 1,000 mg by mouth in the morning and at bedtime.   montelukast (SINGULAIR) 10 MG tablet Take one tablet by mouth daily.   olopatadine (PATANOL) 0.1 % ophthalmic solution 1 drop 2 (two) times daily.   omeprazole (PRILOSEC) 40 MG capsule Take 1 capsule (40 mg total) by mouth daily.   PROAIR HFA 108 (90 BASE) MCG/ACT inhaler INHALE 2 PUFFS 4 TIMES A DAY AS NEEDED...NEEDS OFFICE VISIT FOR MORE   sucralfate (CARAFATE) 1 g tablet Take 1 tablet (1 g total) by mouth 4 (four) times daily -  with meals and at bedtime. (Patient taking differently: Take 1 g by mouth as needed.)   [DISCONTINUED]  rosuvastatin (CRESTOR) 5 MG tablet Take 5 mg by mouth daily.     Allergies:    Celebrex [celecoxib], Meloxicam, Procaine hcl, and Penicillins   Social History: Social History   Socioeconomic History   Marital status: Married    Spouse name: Not on file   Number of children: 0   Years of education: Not on file   Highest education level: Not on file  Occupational History   Occupation: TEACHER    Employer: [employer]  Tobacco Use   Smoking status: Former Smoker    Packs/day: 0.25    Years: 20.00    Pack years: 5.00   Smokeless tobacco: Never Used  Scientific laboratory technician Use: Never used  Substance and Sexual Activity   Alcohol use: Yes    Alcohol/week: 0.0 - 1.0 standard drinks    Comment: occ   Drug use: No   Sexual activity: Not Currently    Partners: Male    Comment: husband vasectomy  Other Topics Concern   Not on file  Social History Narrative   Not on file   Social Determinants of Health   Financial Resource Strain: Not on file  Food Insecurity: Not on file  Transportation Needs: Not on file  Physical Activity: Not on file  Stress: Not on file  Social Connections: Not on file     Family History: The patient's family history includes Alcohol abuse in her brother and brother; Arthritis in her sister and another family member; Diabetes in her brother, mother, and sister; Heart attack in her sister; Heart attack (age of onset: 55) in her sister; Rheum arthritis in her niece, sister, and sister; Stomach cancer in her brother. There is no history of Colon cancer, Colon polyps, Esophageal cancer, or Rectal cancer.  ROS:   All other ROS reviewed and negative. Pertinent positives noted in the HPI.     EKGs/Labs/Other Studies Reviewed:   The following studies were personally reviewed by me today:  EKG:  EKG is ordered today.  The ekg ordered today demonstrates normal sinus rhythm heart rate 76, no acute ischemic changes, no evidence of  infarction, low voltage noted, and was personally reviewed by me.   Recent Labs: No results found for requested labs within last 8760 hours.   Recent Lipid Panel    Component Value Date/Time   CHOL 255 (H) 10/23/2012 1303   TRIG 382 (H) 10/23/2012 1303   HDL 40  10/23/2012 1303   CHOLHDL 6.4 10/23/2012 1303   VLDL 76 (H) 10/23/2012 1303   LDLCALC 139 (H) 10/23/2012 1303    Physical Exam:   VS:  BP 110/70    Pulse 76    Ht 4\' 10"  (1.473 m)    Wt 189 lb (85.7 kg)    LMP 06/09/2012    SpO2 98%    BMI 39.50 kg/m    Wt Readings from Last 3 Encounters:  07/19/20 189 lb (85.7 kg)  07/05/20 189 lb 8 oz (86 kg)  04/06/20 196 lb 9.6 oz (89.2 kg)    General: Well nourished, well developed, in no acute distress Head: Atraumatic, normal size  Eyes: PEERLA, EOMI  Neck: Supple, no JVD Endocrine: No thryomegaly Cardiac: Normal S1, S2; RRR; no murmurs, rubs, or gallops Lungs: Clear to auscultation bilaterally, no wheezing, rhonchi or rales  Abd: Soft, nontender, no hepatomegaly  Ext: No edema, pulses 2+ Musculoskeletal: No deformities, BUE and BLE strength normal and equal Skin: Warm and dry, no rashes   Neuro: Alert and oriented to person, place, time, and situation, CNII-XII grossly intact, no focal deficits  Psych: Normal mood and affect   ASSESSMENT:   Leontina Skidmore is a 60 y.o. female who presents for the following: 1. Family history of heart disease   2. Mixed hyperlipidemia   3. Obesity (BMI 30-39.9)     PLAN:   1. Family history of heart disease 2. Mixed hyperlipidemia 3. Obesity (BMI 30-39.9) -Family history of heart disease.  CVD risk factors include obesity with a BMI of 39.  Diabetes.  Most recent A1c 7.4.  LDL cholesterol 73.  She is on 5 mg of Crestor.  Given her history of diabetes and family history of heart disease I would recommend increase her Crestor to 10 mg a day.  I would like to get her LDL cholesterol less than 70.  For further risk ratification of  recommended coronary calcium scoring.  Her EKG in office demonstrates no acute ischemic change or evidence of prior infarction.  She has no symptoms of heart disease.  We will start here.  She may need a stress test if her coronary calcium score is above 400.  Right now I see no need for that.  She has no symptoms.  She will see me yearly.  I will notify her of her results by phone.  Mainstay of treatment is aspirin 81 mg a day to get her LDL cholesterol less than 70.  Disposition: Return in about 1 year (around 07/19/2021).  Medication Adjustments/Labs and Tests Ordered: Current medicines are reviewed at length with the patient today.  Concerns regarding medicines are outlined above.  Orders Placed This Encounter  Procedures   CT CARDIAC SCORING (SELF PAY ONLY)   EKG 12-Lead   Meds ordered this encounter  Medications   DISCONTD: rosuvastatin (CRESTOR) 20 MG tablet    Sig: Take 1 tablet (20 mg total) by mouth daily.    Dispense:  90 tablet    Refill:  1   rosuvastatin (CRESTOR) 10 MG tablet    Sig: Take 1 tablet (10 mg total) by mouth daily.    Dispense:  90 tablet    Refill:  1    Discontinue 20 mg RX    Patient Instructions  Medication Instructions:  Increase Crestor to 20 mg daily   *If you need a refill on your cardiac medications before your next appointment, please call your pharmacy*   Testing/Procedures:  CALCIUM SCORE  Follow-Up: At [facility], you and your health needs are our priority.  As part of our continuing mission to provide you with exceptional heart care, we have created designated Provider Care Teams.  These Care Teams include your primary Cardiologist (physician) and Advanced Practice Providers (APPs -  Physician Assistants and Nurse Practitioners) who all work together to provide you with the care you need, when you need it.  We recommend signing up for the patient portal called "MyChart".  Sign up information is provided on this After Visit  Summary.  MyChart is used to connect with patients for Virtual Visits (Telemedicine).  Patients are able to view lab/test results, encounter notes, upcoming appointments, etc.  Non-urgent messages can be sent to your provider as well.   To learn more about what you can do with MyChart, go to [email or web address].    Your next appointment:   12 month(s)  The format for your next appointment:   In Person  Provider:   Eleonore Chiquito, MD   Other Instructions  [email or web address]  [email or web address]    Signed, Addison Naegeli. Audie Box, [place]  [address], [place], [place] [address] [phone]  07/19/2020 4:02 PM

## 2020-07-19 ENCOUNTER — Encounter: Payer: Self-pay | Admitting: Cardiovascular Disease

## 2020-07-19 ENCOUNTER — Other Ambulatory Visit: Payer: Self-pay

## 2020-07-19 ENCOUNTER — Ambulatory Visit: Payer: BC Managed Care – PPO | Admitting: Cardiovascular Disease

## 2020-07-19 VITALS — BP 110/70 | HR 76 | Ht <= 58 in | Wt 189.0 lb

## 2020-07-19 DIAGNOSIS — I10 Essential (primary) hypertension: Secondary | ICD-10-CM

## 2020-07-19 DIAGNOSIS — E782 Mixed hyperlipidemia: Secondary | ICD-10-CM | POA: Diagnosis not present

## 2020-07-19 DIAGNOSIS — E669 Obesity, unspecified: Secondary | ICD-10-CM

## 2020-07-19 DIAGNOSIS — Z8249 Family history of ischemic heart disease and other diseases of the circulatory system: Secondary | ICD-10-CM

## 2020-07-19 MED ORDER — ROSUVASTATIN CALCIUM 10 MG PO TABS: 10.0000 mg | ORAL_TABLET | Freq: Every day | ORAL | 1 refills | Status: DC

## 2020-07-19 MED ORDER — ROSUVASTATIN CALCIUM 20 MG PO TABS: 20.0000 mg | ORAL_TABLET | Freq: Every day | ORAL | 1 refills | Status: DC

## 2020-07-19 NOTE — Patient Instructions (Signed)
Medication Instructions:  Increase Crestor to 20 mg daily   *If you need a refill on your cardiac medications before your next appointment, please call your pharmacy*   Testing/Procedures:  CALCIUM SCORE   Follow-Up: At [facility], you and your health needs are our priority.  As part of our continuing mission to provide you with exceptional heart care, we have created designated Provider Care Teams.  These Care Teams include your primary Cardiologist (physician) and Advanced Practice Providers (APPs -  Physician Assistants and Nurse Practitioners) who all work together to provide you with the care you need, when you need it.  We recommend signing up for the patient portal called "MyChart".  Sign up information is provided on this After Visit Summary.  MyChart is used to connect with patients for Virtual Visits (Telemedicine).  Patients are able to view lab/test results, encounter notes, upcoming appointments, etc.  Non-urgent messages can be sent to your provider as well.   To learn more about what you can do with MyChart, go to [email or web address].    Your next appointment:   12 month(s)  The format for your next appointment:   In Person  Provider:   Eleonore Chiquito, MD   Other Instructions  [email or web address]  [email or web address]

## 2020-07-23 ENCOUNTER — Telehealth: Payer: Self-pay | Admitting: Gastroenterology

## 2020-07-23 NOTE — Telephone Encounter (Signed)
Inbound call from patient requesting a call back from a nurse please.  States she is having some sxs and would like to see if she can have EGD sooner.

## 2020-07-23 NOTE — Telephone Encounter (Signed)
Shirlean Mylar Do you know anything about the plan/procedures for this patient?

## 2020-07-23 NOTE — Telephone Encounter (Signed)
Dr Silverio Decamp, You wanted this patient to have a recall Colon/EGD in June 2022, please advise or would you like to see this patient in the office first

## 2020-07-24 NOTE — Telephone Encounter (Signed)
Ok to proceed with EGD sooner and we can hold off colonoscopy until patient feels she can proceed with bowel prep. Thanks

## 2020-07-25 NOTE — Telephone Encounter (Signed)
Called pt to let her know It is ok for her to schedule endoscopy now and she can do the colonoscopy in June as recall date states.  Per Dr Silverio Decamp pt can go ahead and schedule EGD for GERD .  Patient will not need an office appointment just a pre-visit for this

## 2020-07-25 NOTE — Telephone Encounter (Signed)
Patient will call back because she needs to look at her schedule

## 2020-07-26 ENCOUNTER — Encounter: Payer: Self-pay | Admitting: Gastroenterology

## 2020-07-26 NOTE — Telephone Encounter (Signed)
Please advise her to take omeprazole 40 mg daily and Pepcid 20 mg at bedtime as needed.  If she has persistent breakthrough symptoms she can use Carafate before meals and at bedtime as needed. Please let her know that we can contact her if we have any cancellations to move the appointment earlier.  Thank you

## 2020-07-26 NOTE — Telephone Encounter (Signed)
Dr Nandigam Please advise  

## 2020-07-26 NOTE — Telephone Encounter (Signed)
Pt is requesting a call back from a nurse to discuss what she can take for her GERD in the meantime, next available for EGD is 4/4 and pt does not wish to wait that long.

## 2020-07-30 NOTE — Telephone Encounter (Signed)
Inbound call from patient returning your call. 

## 2020-07-30 NOTE — Telephone Encounter (Signed)
FYI Dr Silverio Decamp Called patient again to tell her we had a cancellation for 3-1.  Patient told me she has decided to switch providers and go to another practice because it was too much back and forth with the phone calls....Marland KitchenMarland Kitchen

## 2020-07-31 NOTE — Telephone Encounter (Signed)
ok 

## 2020-08-17 ENCOUNTER — Other Ambulatory Visit: Payer: Self-pay

## 2020-08-17 ENCOUNTER — Ambulatory Visit (INDEPENDENT_AMBULATORY_CARE_PROVIDER_SITE_OTHER)
Admission: RE | Admit: 2020-08-17 | Discharge: 2020-08-17 | Disposition: A | Discharge status: Home / Self Care | Payer: Self-pay | Source: Ambulatory Visit | Attending: Cardiovascular Disease | Admitting: Cardiovascular Disease

## 2020-08-17 DIAGNOSIS — Z8249 Family history of ischemic heart disease and other diseases of the circulatory system: Secondary | ICD-10-CM

## 2021-01-31 ENCOUNTER — Other Ambulatory Visit: Payer: Self-pay | Admitting: Family Medicine

## 2021-01-31 DIAGNOSIS — Z1231 Encounter for screening mammogram for malignant neoplasm of breast: Secondary | ICD-10-CM

## 2021-02-06 ENCOUNTER — Encounter: Payer: Self-pay | Admitting: Obstetrics and Gynecology

## 2021-02-06 ENCOUNTER — Ambulatory Visit (INDEPENDENT_AMBULATORY_CARE_PROVIDER_SITE_OTHER): Payer: BC Managed Care – PPO | Admitting: Obstetrics and Gynecology

## 2021-02-06 ENCOUNTER — Other Ambulatory Visit: Payer: Self-pay

## 2021-02-06 VITALS — BP 118/72 | HR 77 | Ht <= 58 in | Wt 187.0 lb

## 2021-02-06 DIAGNOSIS — Z01419 Encounter for gynecological examination (general) (routine) without abnormal findings: Secondary | ICD-10-CM | POA: Diagnosis not present

## 2021-02-06 DIAGNOSIS — Z6839 Body mass index (BMI) 39.0-39.9, adult: Secondary | ICD-10-CM

## 2021-02-06 NOTE — Progress Notes (Signed)
59 y.o. G2P0020 Married Asian Not Hispanic or Latino female here for annual exam.  No vaginal bleeding, not sexually active.   No bowel or bladder c/o. She went to PT for OAB, better. May leak a little on the way to the bathroom.   Last HgbA1C was 6.3%.    Patient's last menstrual period was 06/09/2012.          Sexually active: No.  The current method of family planning is post menopausal status.    Exercising: Yes.     Walking  Smoker:  no  Health Maintenance: Pap:  01/26/20 WNL HR HPV Neg  09/17/15 Neg. HR HPV:neg              11/19/12 Neg. HR HPV:neg History of abnormal Pap:  no MMG:  02/24/20 Bi-rads 1 neg  BMD:   07/12/09 normal  Colonoscopy:11/13/2020 normal f/u 10 years  TDaP:  2019  Gardasil: n/a   reports that she has quit smoking. She has a 5.00 pack-year smoking history. She has never used smokeless tobacco. She reports current alcohol use. 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   Past Medical History:  Diagnosis Date   Allergic rhinitis    Allergy    seasonal allergies   Anemia    hx of   Anxiety    hx of    Arthritis    Asthma    started in 20's   Depression    hx of   DM (diabetes mellitus) ([place])    on meds   Esophageal stricture    Fundic gland polyps of stomach, benign    GERD (gastroesophageal reflux disease)    on meds   Hepatic steatosis    Hiatal hernia    Hyperlipidemia    on meds   Hypothyroid    on meds   Kidney stones    hx of kidney stones   Osteoarthritis    Panic disorder    when traveling   Psoriasis    per patient     Past Surgical History:  Procedure Laterality Date   CHOLECYSTECTOMY     COLONOSCOPY  2011   DB-normal    DILATION AND CURETTAGE OF UTERUS     sinsus surgery  1996-1997   sinsus drainage    TONSILLECTOMY      Current Outpatient Medications  Medication Sig Dispense Refill   ALPRAZolam (XANAX PO) Take 1 tablet by mouth as needed.     cetirizine  (ZYRTEC) 10 MG tablet Take 1 tablet (10 mg total) by mouth daily. 90 tablet 3   Cholecalciferol (VITAMIN D3) 2000 UNITS capsule Take 2,000 Units by mouth daily.     fluticasone (FLONASE) 50 MCG/ACT nasal spray Place 2 sprays into the nose every morning. NEED REFILLS     levothyroxine (SYNTHROID, LEVOTHROID) 75 MCG tablet Take 75 mcg by mouth daily.  1   metFORMIN (GLUCOPHAGE-XR) 500 MG 24 hr tablet Take 1,000 mg by mouth in the morning and at bedtime.     montelukast (SINGULAIR) 10 MG tablet Take one tablet by mouth daily. 30 tablet 2   olopatadine (PATANOL) 0.1 % ophthalmic solution 1 drop 2 (two) times daily.     omeprazole (PRILOSEC) 40 MG capsule Take 1 capsule (40 mg total) by mouth daily. 30 capsule 11   PROAIR HFA 108 (90 BASE) MCG/ACT inhaler INHALE 2 PUFFS 4 TIMES A DAY AS NEEDED...NEEDS OFFICE VISIT FOR MORE 8.5 each  1   rosuvastatin (CRESTOR) 10 MG tablet Take 1 tablet (10 mg total) by mouth daily. 90 tablet 1   sucralfate (CARAFATE) 1 g tablet Take 1 tablet (1 g total) by mouth 4 (four) times daily -  with meals and at bedtime. (Patient taking differently: Take 1 g by mouth as needed.) 120 tablet 3   No current facility-administered medications for this visit.    Family History  Problem Relation Age of Onset   Rheum arthritis Sister    Diabetes Mother    Stomach cancer Brother        drinker   Alcohol abuse Brother    Diabetes Brother    Alcohol abuse Brother    Arthritis Other        through out the family   Diabetes Sister    Heart attack Sister    Arthritis Sister    Rheum arthritis Sister    Heart attack Sister 29   Rheum arthritis Niece    Colon cancer Neg Hx    Colon polyps Neg Hx    Esophageal cancer Neg Hx    Rectal cancer Neg Hx     Review of Systems  Exam:   BP 118/72   Pulse 77   Ht '4\' 10"'$  (1.473 m)   Wt 187 lb (84.8 kg)   LMP 06/09/2012   SpO2 99%   BMI 39.08 kg/m   Weight change: '@WEIGHTCHANGE'$ @ Height:   Height: '4\' 10"'$  (147.3 cm)  Ht Readings  from Last 3 Encounters:  02/06/21 '4\' 10"'$  (1.473 m)  07/19/20 '4\' 10"'$  (1.473 m)  07/05/20 '4\' 10"'$  (1.473 m)    General appearance: alert, cooperative and appears stated age Head: Normocephalic, without obvious abnormality, atraumatic Neck: no adenopathy, supple, symmetrical, trachea midline and thyroid normal to inspection and palpation Lungs: clear to auscultation bilaterally Cardiovascular: regular rate and rhythm Breasts: normal appearance, no masses or tenderness Abdomen: soft, non-tender; non distended,  no masses,  no organomegaly Extremities: extremities normal, atraumatic, no cyanosis or edema Skin: Skin color, texture, turgor normal. No rashes or lesions Lymph nodes: Cervical, supraclavicular, and axillary nodes normal. No abnormal inguinal nodes palpated Neurologic: Grossly normal   Pelvic: External genitalia:  no lesions              Urethra:  normal appearing urethra with no masses, tenderness or lesions              Bartholins and Skenes: normal                 Vagina: atrophic appearing vagina with normal color and discharge, no lesions              Cervix: no lesions               Bimanual Exam:  Uterus:   no masses or tenderness              Adnexa: no mass, fullness, tenderness               Rectovaginal: Confirms               Anus:  normal sphincter tone, no lesions  Gae Dry chaperoned for the exam.  1. Well woman exam No pap this year Mammogram scheduled Colonoscopy UTD Discussed breast self exam Discussed calcium and vit D intake   2. BMI 39.0-39.9,adult Given # for healthy weight and wellness clinic

## 2021-02-06 NOTE — Patient Instructions (Signed)

## 2021-03-01 ENCOUNTER — Other Ambulatory Visit: Payer: Self-pay

## 2021-03-01 ENCOUNTER — Ambulatory Visit
Admission: RE | Admit: 2021-03-01 | Discharge: 2021-03-01 | Disposition: A | Discharge status: Home / Self Care | Payer: BC Managed Care – PPO | Source: Ambulatory Visit | Attending: Family Medicine | Admitting: Family Medicine

## 2021-03-01 DIAGNOSIS — Z1231 Encounter for screening mammogram for malignant neoplasm of breast: Secondary | ICD-10-CM

## 2021-04-25 ENCOUNTER — Other Ambulatory Visit: Payer: Self-pay | Admitting: Cardiovascular Disease

## 2021-05-13 ENCOUNTER — Other Ambulatory Visit: Payer: Self-pay | Admitting: Family Medicine

## 2021-05-13 DIAGNOSIS — E2839 Other primary ovarian failure: Secondary | ICD-10-CM

## 2021-07-18 NOTE — Progress Notes (Signed)
Cardiology Office Note:   Date:  07/19/2021  NAME:  Wendy Curtis    MRN: [id number] DOB:  [birth year]   PCP:  Lujean Amel, MD  Cardiologist:  None  Electrophysiologist:  None   Referring MD: Lujean Amel, MD   Chief Complaint  Patient presents with   Follow-up         History of Present Illness:   Wendy Curtis is a 61 y.o. female with a hx of obesity, hypertension, hyperlipidemia who presents for follow-up.  Evaluated last year for family history of heart disease.  Coronary calcium score 0.  She reports she is doing well.  Denies any chest pain or trouble breathing.  She is walking 6000 steps per day.  No issues with this.  She actually has lost roughly 15 to 20 pounds since her last appointment.  She reports she is working on her portion control.  Seems to be doing well.  Blood pressure 122/68.  Her most recent cholesterol is at goal.  A1c has improved as well.  All of her cardiovascular risk factors are well controlled.  Problem List 1. DM -A1c 6.5 2. Obesity -BMI 39 3. HTN 4. HLD -Total chol 133, HDL 45, LDL 66, triglycerides 126 -CAC score 0 08/17/2020  Past Medical History: Past Medical History:  Diagnosis Date   Allergic rhinitis    Allergy    seasonal allergies   Anemia    hx of   Anxiety    hx of    Arthritis    Asthma    started in 20's   Depression    hx of   DM (diabetes mellitus) ([place])    on meds   Esophageal stricture    Fundic gland polyps of stomach, benign    GERD (gastroesophageal reflux disease)    on meds   Hepatic steatosis    Hiatal hernia    Hyperlipidemia    on meds   Hypothyroid    on meds   Kidney stones    hx of kidney stones   Osteoarthritis    Panic disorder    when traveling   Psoriasis    per patient     Past Surgical History: Past Surgical History:  Procedure Laterality Date   CHOLECYSTECTOMY     COLONOSCOPY  2011   DB-normal    DILATION AND CURETTAGE OF UTERUS     sinsus surgery  1996-1997   sinsus  drainage    TONSILLECTOMY      Current Medications: Current Meds  Medication Sig   ALPRAZolam (XANAX PO) Take 1 tablet by mouth as needed.   cetirizine (ZYRTEC) 10 MG tablet Take 1 tablet (10 mg total) by mouth daily.   Cholecalciferol (VITAMIN D3) 2000 UNITS capsule Take 2,000 Units by mouth daily.   fluticasone (FLONASE) 50 MCG/ACT nasal spray Place 2 sprays into the nose every morning. NEED REFILLS   levothyroxine (SYNTHROID, LEVOTHROID) 75 MCG tablet Take 75 mcg by mouth daily.   metFORMIN (GLUCOPHAGE-XR) 500 MG 24 hr tablet Take 1,000 mg by mouth in the morning and at bedtime.   montelukast (SINGULAIR) 10 MG tablet Take one tablet by mouth daily.   olopatadine (PATANOL) 0.1 % ophthalmic solution 1 drop 2 (two) times daily.   omeprazole (PRILOSEC) 40 MG capsule Take 1 capsule (40 mg total) by mouth daily.   PROAIR HFA 108 (90 BASE) MCG/ACT inhaler INHALE 2 PUFFS 4 TIMES A DAY AS NEEDED...NEEDS OFFICE VISIT FOR MORE   rosuvastatin (CRESTOR) 10 MG tablet  TAKE 1 TABLET BY MOUTH EVERY DAY   sucralfate (CARAFATE) 1 g tablet Take 1 tablet (1 g total) by mouth 4 (four) times daily -  with meals and at bedtime. (Patient taking differently: Take 1 g by mouth as needed.)     Allergies:    Celebrex [celecoxib], Meloxicam, Procaine hcl, and Penicillins   Social History: Social History   Socioeconomic History   Marital status: Married    Spouse name: Not on file   Number of children: 0   Years of education: Not on file   Highest education level: Not on file  Occupational History   Occupation: TEACHER    Employer: [employer]  Tobacco Use   Smoking status: Former    Packs/day: 0.25    Years: 20.00    Pack years: 5.00    Types: Cigarettes   Smokeless tobacco: Never  Vaping Use   Vaping Use: Never used  Substance and Sexual Activity   Alcohol use: Yes    Alcohol/week: 0.0 - 1.0 standard drinks    Comment: occ   Drug use: No   Sexual activity: Not Currently     Partners: Male    Comment: husband vasectomy  Other Topics Concern   Not on file  Social History Narrative   Not on file   Social Determinants of Health   Financial Resource Strain: Not on file  Food Insecurity: Not on file  Transportation Needs: Not on file  Physical Activity: Not on file  Stress: Not on file  Social Connections: Not on file     Family History: The patient's family history includes Alcohol abuse in her brother and brother; Arthritis in her sister and another family member; Diabetes in her brother, mother, and sister; Heart attack in her sister; Heart attack (age of onset: 39) in her sister; Rheum arthritis in her niece, sister, and sister; Stomach cancer in her brother. There is no history of Colon cancer, Colon polyps, Esophageal cancer, or Rectal cancer.  ROS:   All other ROS reviewed and negative. Pertinent positives noted in the HPI.     EKGs/Labs/Other Studies Reviewed:   The following studies were personally reviewed by me today:  CAC Score 08/17/2020  IMPRESSION: 1.  Coronary calcium score of 0.   2.  Large hiatal hernia.   Eleonore Chiquito, MD  Recent Labs: No results found for requested labs within last 8760 hours.   Recent Lipid Panel    Component Value Date/Time   CHOL 255 (H) 10/23/2012 1303   TRIG 382 (H) 10/23/2012 1303   HDL 40 10/23/2012 1303   CHOLHDL 6.4 10/23/2012 1303   VLDL 76 (H) 10/23/2012 1303   LDLCALC 139 (H) 10/23/2012 1303    Physical Exam:   VS:  BP 122/68    Pulse 75    Ht 4\' 10"  (1.473 m)    Wt 182 lb 3.2 oz (82.6 kg)    LMP 06/09/2012    SpO2 99%    BMI 38.08 kg/m    Wt Readings from Last 3 Encounters:  07/19/21 182 lb 3.2 oz (82.6 kg)  02/06/21 187 lb (84.8 kg)  07/19/20 189 lb (85.7 kg)    General: Well nourished, well developed, in no acute distress Head: Atraumatic, normal size  Eyes: PEERLA, EOMI  Neck: Supple, no JVD Endocrine: No thryomegaly Cardiac: Normal S1, S2; RRR; no murmurs, rubs, or  gallops Lungs: Clear to auscultation bilaterally, no wheezing, rhonchi or rales  Abd: Soft, nontender, no hepatomegaly  Ext: No edema, pulses 2+ Musculoskeletal: No deformities, BUE and BLE strength normal and equal Skin: Warm and dry, no rashes   Neuro: Alert and oriented to person, place, time, and situation, CNII-XII grossly intact, no focal deficits  Psych: Normal mood and affect   ASSESSMENT:   Anya Murphey is a 60 y.o. female who presents for the following: 1. Family history of heart disease   2. Mixed hyperlipidemia   3. Obesity (BMI 30-39.9)     PLAN:   1. Family history of heart disease 2. Mixed hyperlipidemia 3. Obesity (BMI 30-39.9) -Evaluated last year for family history of heart disease.  Coronary calcium score of 0.  She is diabetic.  She does have risk factors.  Her most recent A1c is 6.5.  Her most recent LDL cholesterol is 66.  This is at goal for her diabetes.  She should continue Crestor 10 mg daily.  I encouraged her to exercise 150 minutes/week of moderate to vigorous physical activity.  She seems to have lost 15 to 20 pounds and I congratulated her on this.  All of her numbers seem to be at goal.  She is doing everything she can do to reduce her chances of having a heart attack or stroke.  I recommended to repeat a calcium score in 5 years.  Otherwise she should discontinue on her current regimen.  Disposition: Return if symptoms worsen or fail to improve.  Medication Adjustments/Labs and Tests Ordered: Current medicines are reviewed at length with the patient today.  Concerns regarding medicines are outlined above.  No orders of the defined types were placed in this encounter.  No orders of the defined types were placed in this encounter.   Patient Instructions  Medication Instructions:  The current medical regimen is effective;  continue present plan and medications.  *If you need a refill on your cardiac medications before your next appointment, please  call your pharmacy*   Follow-Up: At [facility], you and your health needs are our priority.  As part of our continuing mission to provide you with exceptional heart care, we have created designated Provider Care Teams.  These Care Teams include your primary Cardiologist (physician) and Advanced Practice Providers (APPs -  Physician Assistants and Nurse Practitioners) who all work together to provide you with the care you need, when you need it.  We recommend signing up for the patient portal called "MyChart".  Sign up information is provided on this After Visit Summary.  MyChart is used to connect with patients for Virtual Visits (Telemedicine).  Patients are able to view lab/test results, encounter notes, upcoming appointments, etc.  Non-urgent messages can be sent to your provider as well.   To learn more about what you can do with MyChart, go to [email or web address].    Your next appointment:   As needed  The format for your next appointment:   In Person  Provider:   Eleonore Chiquito, MD      Time Spent with Patient: I have spent a total of 25 minutes with patient reviewing hospital notes, telemetry, EKGs, labs and examining the patient as well as establishing an assessment and plan that was discussed with the patient.  > 50% of time was spent in direct patient care.  Signed, Addison Naegeli. Audie Box, MD, [place]  [address] [place], [place] [address] [phone]  07/19/2021 2:42 PM

## 2021-07-19 ENCOUNTER — Other Ambulatory Visit: Payer: Self-pay

## 2021-07-19 ENCOUNTER — Encounter: Payer: Self-pay | Admitting: Cardiovascular Disease

## 2021-07-19 ENCOUNTER — Ambulatory Visit: Payer: BC Managed Care – PPO | Admitting: Cardiovascular Disease

## 2021-07-19 VITALS — BP 122/68 | HR 75 | Ht <= 58 in | Wt 182.2 lb

## 2021-07-19 DIAGNOSIS — E782 Mixed hyperlipidemia: Secondary | ICD-10-CM | POA: Diagnosis not present

## 2021-07-19 DIAGNOSIS — Z8249 Family history of ischemic heart disease and other diseases of the circulatory system: Secondary | ICD-10-CM | POA: Diagnosis not present

## 2021-07-19 DIAGNOSIS — E669 Obesity, unspecified: Secondary | ICD-10-CM

## 2021-07-19 NOTE — Patient Instructions (Signed)
Medication Instructions:  The current medical regimen is effective;  continue present plan and medications.  *If you need a refill on your cardiac medications before your next appointment, please call your pharmacy*    Follow-Up: At [facility], you and your health needs are our priority.  As part of our continuing mission to provide you with exceptional heart care, we have created designated Provider Care Teams.  These Care Teams include your primary Cardiologist (physician) and Advanced Practice Providers (APPs -  Physician Assistants and Nurse Practitioners) who all work together to provide you with the care you need, when you need it.  We recommend signing up for the patient portal called "MyChart".  Sign up information is provided on this After Visit Summary.  MyChart is used to connect with patients for Virtual Visits (Telemedicine).  Patients are able to view lab/test results, encounter notes, upcoming appointments, etc.  Non-urgent messages can be sent to your provider as well.   To learn more about what you can do with MyChart, go to [email or web address].    Your next appointment:   As needed  The format for your next appointment:   In Person  Provider:   [place] O'Neal, MD      

## 2021-08-02 ENCOUNTER — Other Ambulatory Visit: Payer: Self-pay

## 2021-08-02 ENCOUNTER — Emergency Department (HOSPITAL_BASED_OUTPATIENT_CLINIC_OR_DEPARTMENT_OTHER)
Admission: EM | Admit: 2021-08-02 | Discharge: 2021-08-02 | Disposition: A | Discharge status: Home / Self Care | Payer: BC Managed Care – PPO | Attending: Emergency Medicine | Admitting: Emergency Medicine

## 2021-08-02 ENCOUNTER — Emergency Department (HOSPITAL_BASED_OUTPATIENT_CLINIC_OR_DEPARTMENT_OTHER): Payer: BC Managed Care – PPO

## 2021-08-02 ENCOUNTER — Emergency Department (HOSPITAL_BASED_OUTPATIENT_CLINIC_OR_DEPARTMENT_OTHER): Payer: BC Managed Care – PPO | Admitting: Radiology

## 2021-08-02 ENCOUNTER — Encounter (HOSPITAL_BASED_OUTPATIENT_CLINIC_OR_DEPARTMENT_OTHER): Payer: Self-pay | Admitting: *Deleted

## 2021-08-02 DIAGNOSIS — M25511 Pain in right shoulder: Secondary | ICD-10-CM | POA: Diagnosis not present

## 2021-08-02 DIAGNOSIS — Y92410 Unspecified street and highway as the place of occurrence of the external cause: Secondary | ICD-10-CM | POA: Diagnosis not present

## 2021-08-02 DIAGNOSIS — S8002XA Contusion of left knee, initial encounter: Secondary | ICD-10-CM | POA: Diagnosis not present

## 2021-08-02 DIAGNOSIS — R0789 Other chest pain: Secondary | ICD-10-CM | POA: Insufficient documentation

## 2021-08-02 DIAGNOSIS — S1093XA Contusion of unspecified part of neck, initial encounter: Secondary | ICD-10-CM | POA: Insufficient documentation

## 2021-08-02 DIAGNOSIS — S199XXA Unspecified injury of neck, initial encounter: Secondary | ICD-10-CM | POA: Diagnosis present

## 2021-08-02 MED ORDER — ACETAMINOPHEN 325 MG PO TABS
650.0000 mg | ORAL_TABLET | Freq: Once | ORAL | Status: DC
  Filled 2021-08-02: qty 2

## 2021-08-02 MED ORDER — METHOCARBAMOL 500 MG PO TABS: 500.0000 mg | ORAL_TABLET | Freq: Two times a day (BID) | ORAL | 0 refills | Status: DC
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
## 2021-08-02 NOTE — ED Notes (Signed)
Pt took a 500mg  tablet of tylenol and 200mg  of advil from her personal supply while she was in the room. Provider notified via message about wether he wants to continue his prescribed Tylenol aswell.
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
## 2021-10-21 ENCOUNTER — Other Ambulatory Visit: Payer: Self-pay | Admitting: Family Medicine

## 2021-10-21 ENCOUNTER — Ambulatory Visit
Admission: RE | Admit: 2021-10-21 | Discharge: 2021-10-21 | Disposition: A | Discharge status: Home / Self Care | Payer: BC Managed Care – PPO | Source: Ambulatory Visit | Attending: Family Medicine | Admitting: Family Medicine

## 2021-10-21 DIAGNOSIS — Z1231 Encounter for screening mammogram for malignant neoplasm of breast: Secondary | ICD-10-CM

## 2021-10-21 DIAGNOSIS — E2839 Other primary ovarian failure: Secondary | ICD-10-CM

## 2021-10-27 ENCOUNTER — Other Ambulatory Visit: Payer: Self-pay | Admitting: Cardiovascular Disease

## 2022-02-05 NOTE — Progress Notes (Signed)
61 y.o. G2P0020 Married Asian Not Hispanic or Latino female here for annual exam.  No vaginal bleeding.  H/O OAB, had PT. Better.  No bowel c/o.    Last HgbA1C was 6.6%  Patient's last menstrual period was 06/09/2012.          Sexually active: No.  The current method of family planning is abstinence.    Exercising: Yes.    Home exercise routine includes walking 1 hrs per day. Smoker:  no  Health Maintenance: Pap:   01/26/20 WNL HR HPV Neg  09/17/15 Neg. HR HPV:neg              11/19/12 Neg. HR HPV:neg History of abnormal Pap:  no MMG:  03/07/21 density B Bi-rads 1 neg  BMD:   10/21/21 normal  Colonoscopy: 11/13/2020 normal f/u 10 years  TDaP:  2019  Gardasil: n/a   reports that she has quit smoking. Her smoking use included cigarettes. She has a 5.00 pack-year smoking history. She has never used smokeless tobacco. She reports current alcohol use. She reports that she does not use drugs. Just occasional ETOH. 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   Past Medical History:  Diagnosis Date   Allergic rhinitis    Allergy    seasonal allergies   Anemia    hx of   Anxiety    hx of    Arthritis    Asthma    started in 20's   Depression    hx of   DM (diabetes mellitus) ([place])    on meds   Esophageal stricture    Fundic gland polyps of stomach, benign    GERD (gastroesophageal reflux disease)    on meds   Hepatic steatosis    Hiatal hernia    Hyperlipidemia    on meds   Hypothyroid    on meds   Kidney stones    hx of kidney stones   Osteoarthritis    Panic disorder    when traveling   Psoriasis    per patient     Past Surgical History:  Procedure Laterality Date   CHOLECYSTECTOMY     COLONOSCOPY  2011   DB-normal    DILATION AND CURETTAGE OF UTERUS     sinsus surgery  1996-1997   sinsus drainage    TONSILLECTOMY      Current Outpatient Medications  Medication Sig Dispense Refill   ALPRAZolam (XANAX PO) Take 1 tablet by mouth as  needed.     cetirizine (ZYRTEC) 10 MG tablet Take 1 tablet (10 mg total) by mouth daily. 90 tablet 3   Cholecalciferol (VITAMIN D3) 2000 UNITS capsule Take 2,000 Units by mouth daily.     fluticasone (FLONASE) 50 MCG/ACT nasal spray Place 2 sprays into the nose every morning. NEED REFILLS     levothyroxine (SYNTHROID, LEVOTHROID) 75 MCG tablet Take 75 mcg by mouth daily.  1   metFORMIN (GLUCOPHAGE-XR) 500 MG 24 hr tablet Take 1,000 mg by mouth in the morning and at bedtime.     methocarbamol (ROBAXIN) 500 MG tablet Take 1 tablet (500 mg total) by mouth 2 (two) times daily. 20 tablet 0   montelukast (SINGULAIR) 10 MG tablet Take one tablet by mouth daily. 30 tablet 2   olopatadine (PATANOL) 0.1 % ophthalmic solution 1 drop 2 (two) times daily.     omeprazole (PRILOSEC) 40 MG capsule Take 1 capsule (40 mg total) by mouth daily. 30 capsule 11   PROAIR  HFA 108 (90 BASE) MCG/ACT inhaler INHALE 2 PUFFS 4 TIMES A DAY AS NEEDED...NEEDS OFFICE VISIT FOR MORE 8.5 each 1   rosuvastatin (CRESTOR) 10 MG tablet TAKE 1 TABLET BY MOUTH EVERY DAY 90 tablet 1   sucralfate (CARAFATE) 1 g tablet Take 1 tablet (1 g total) by mouth 4 (four) times daily -  with meals and at bedtime. (Patient taking differently: Take 1 g by mouth as needed.) 120 tablet 3   No current facility-administered medications for this visit.    Family History  Problem Relation Age of Onset   Rheum arthritis Sister    Diabetes Mother    Stomach cancer Brother        drinker   Alcohol abuse Brother    Diabetes Brother    Alcohol abuse Brother    Arthritis Other        through out the family   Diabetes Sister    Heart attack Sister    Arthritis Sister    Rheum arthritis Sister    Heart attack Sister 10   Rheum arthritis Niece    Colon cancer Neg Hx    Colon polyps Neg Hx    Esophageal cancer Neg Hx    Rectal cancer Neg Hx     Review of Systems  All other systems reviewed and are negative.   Exam:   BP 118/80 (BP Location:  Right Arm, Patient Position: Sitting, Cuff Size: Normal)   Ht '4\' 9"'$  (1.448 m)   Wt 177 lb (80.3 kg)   LMP 06/09/2012   BMI 38.30 kg/m   Weight change: '@WEIGHTCHANGE'$ @ Height:   Height: '4\' 9"'$  (144.8 cm)  Ht Readings from Last 3 Encounters:  02/12/22 '4\' 9"'$  (1.448 m)  07/19/21 '4\' 10"'$  (1.473 m)  02/06/21 '4\' 10"'$  (1.473 m)    General appearance: alert, cooperative and appears stated age Head: Normocephalic, without obvious abnormality, atraumatic Neck: no adenopathy, supple, symmetrical, trachea midline and thyroid normal to inspection and palpation Lungs: clear to auscultation bilaterally Cardiovascular: regular rate and rhythm Breasts: normal appearance, no masses or tenderness Abdomen: soft, non-tender; non distended,  no masses,  no organomegaly Extremities: extremities normal, atraumatic, no cyanosis or edema Skin: Skin color, texture, turgor normal. No rashes or lesions Lymph nodes: Cervical, supraclavicular, and axillary nodes normal. No abnormal inguinal nodes palpated Neurologic: Grossly normal   Pelvic: External genitalia:  no lesions              Urethra:  normal appearing urethra with no masses, tenderness or lesions              Bartholins and Skenes: normal                 Vagina: atrophic appearing vagina with normal color and discharge, no lesions              Cervix: no lesions               Bimanual Exam:  Uterus:   no masses or tenderness              Adnexa: no mass, fullness, tenderness               Rectovaginal: Confirms               Anus:  normal sphincter tone, no lesions  Wandra Scot, RMA chaperoned for the exam.   1. Well woman exam Discussed breast self exam Discussed calcium and vit D intake Mammogram scheduled Pap  and colonoscopy are UTD Labs with primary

## 2022-02-12 ENCOUNTER — Ambulatory Visit (INDEPENDENT_AMBULATORY_CARE_PROVIDER_SITE_OTHER): Payer: BC Managed Care – PPO | Admitting: Obstetrics and Gynecology

## 2022-02-12 ENCOUNTER — Encounter: Payer: Self-pay | Admitting: Obstetrics and Gynecology

## 2022-02-12 VITALS — BP 118/80 | Ht <= 58 in | Wt 177.0 lb

## 2022-02-12 DIAGNOSIS — Z01419 Encounter for gynecological examination (general) (routine) without abnormal findings: Secondary | ICD-10-CM | POA: Diagnosis not present

## 2022-02-12 NOTE — Patient Instructions (Signed)

## 2022-02-16 ENCOUNTER — Other Ambulatory Visit: Payer: Self-pay | Admitting: Cardiovascular Disease

## 2022-03-03 ENCOUNTER — Ambulatory Visit: Payer: BC Managed Care – PPO

## 2022-03-14 ENCOUNTER — Ambulatory Visit: Payer: BC Managed Care – PPO

## 2022-03-28 ENCOUNTER — Ambulatory Visit
Admission: RE | Admit: 2022-03-28 | Discharge: 2022-03-28 | Disposition: A | Discharge status: Home / Self Care | Payer: BC Managed Care – PPO | Source: Ambulatory Visit | Attending: Family Medicine | Admitting: Family Medicine

## 2022-03-28 DIAGNOSIS — Z1231 Encounter for screening mammogram for malignant neoplasm of breast: Secondary | ICD-10-CM

## 2022-06-09 ENCOUNTER — Encounter (HOSPITAL_BASED_OUTPATIENT_CLINIC_OR_DEPARTMENT_OTHER): Payer: Self-pay | Admitting: Emergency Medicine

## 2022-06-09 ENCOUNTER — Emergency Department (HOSPITAL_BASED_OUTPATIENT_CLINIC_OR_DEPARTMENT_OTHER)
Admission: EM | Admit: 2022-06-09 | Discharge: 2022-06-09 | Disposition: A | Discharge status: Home / Self Care | Payer: BC Managed Care – PPO | Attending: Emergency Medicine | Admitting: Emergency Medicine

## 2022-06-09 ENCOUNTER — Emergency Department (HOSPITAL_BASED_OUTPATIENT_CLINIC_OR_DEPARTMENT_OTHER): Payer: BC Managed Care – PPO | Admitting: Radiology

## 2022-06-09 ENCOUNTER — Other Ambulatory Visit: Payer: Self-pay

## 2022-06-09 DIAGNOSIS — E119 Type 2 diabetes mellitus without complications: Secondary | ICD-10-CM | POA: Diagnosis not present

## 2022-06-09 DIAGNOSIS — R079 Chest pain, unspecified: Secondary | ICD-10-CM

## 2022-06-09 DIAGNOSIS — Z7984 Long term (current) use of oral hypoglycemic drugs: Secondary | ICD-10-CM | POA: Diagnosis not present

## 2022-06-09 DIAGNOSIS — K449 Diaphragmatic hernia without obstruction or gangrene: Secondary | ICD-10-CM

## 2022-06-09 DIAGNOSIS — F419 Anxiety disorder, unspecified: Secondary | ICD-10-CM | POA: Diagnosis not present

## 2022-06-09 LAB — BASIC METABOLIC PANEL
Anion gap: 12 (ref 5–15)
BUN: 20 mg/dL (ref 8–23)
CO2: 26 mmol/L (ref 22–32)
Calcium: 9.9 mg/dL (ref 8.9–10.3)
Chloride: 101 mmol/L (ref 98–111)
Creatinine, Ser: 0.73 mg/dL (ref 0.44–1.00)
GFR, Estimated: >60 mL/min (ref >60)
Glucose, Bld: 107 mg/dL — ABNORMAL HIGH (ref 70–99)
Potassium: 4 mmol/L (ref 3.5–5.1)
Sodium: 139 mmol/L (ref 135–145)

## 2022-06-09 LAB — CBC
HCT: 40.5 % (ref 36.0–46.0)
Hemoglobin: 13 g/dL (ref 12.0–15.0)
MCH: 26.3 pg (ref 26.0–34.0)
MCHC: 32.1 g/dL (ref 30.0–36.0)
MCV: 81.8 fL (ref 80.0–100.0)
Platelets: 294 10*3/uL (ref 150–400)
RBC: 4.95 MIL/uL (ref 3.87–5.11)
RDW: 15.1 % (ref 11.5–15.5)
WBC: 9.6 10*3/uL (ref 4.0–10.5)
nRBC: 0 % (ref 0.0–0.2)

## 2022-06-09 LAB — TROPONIN I (HIGH SENSITIVITY)
Troponin I (High Sensitivity): <2 ng/L (ref <18)
Troponin I (High Sensitivity): <2 ng/L (ref <18)

## 2022-06-09 NOTE — Discharge Instructions (Signed)
Your workup today was reassuring for no signs of acute coronary syndrome.  There was no pneumonia on chest x-ray.  Please follow-up as discussed with cardiology for further evaluation and management.  You may consider trying Pepcid in addition to your current GERD medications to see if it provides some additional relief.  If you develop worsening chest pain, shortness of breath, or other life-threatening conditions please return to the emergency department

## 2022-06-09 NOTE — ED Triage Notes (Signed)
Chest pain "for a couple days" Chest pains down left arm and into back. Comes and goes Worse today

## 2022-06-09 NOTE — ED Provider Notes (Signed)
[facility] EMERGENCY DEPT Provider Note   CSN: [id number] Arrival date & time: 06/09/22  1640     History  Chief Complaint  Patient presents with   Chest Pain    Wendy Curtis is a 61 y.o. female.  Patient presents emergency department complaining of central chest pain which has been ongoing for a couple of days.  She states that the pain is mild to moderate in severity.  She denies any shortness of breath.  She does endorse some left shoulder pain but also endorses sleeping strangely over the past few days with her adjustable mattress in a different position than usual.  She also endorses some recent neck strain.  Patient denies abdominal pain, nausea, vomiting.  Today she states the pain was somewhat worse and she felt anxious and started having numbness in her fingers.  Past medical history significant for anxiety, panic disorder, hiatal hernia, GERD, type II DM  HPI     Home Medications Prior to Admission medications   Medication Sig Start Date End Date Taking? Authorizing Provider  ALPRAZolam (XANAX PO) Take 1 tablet by mouth as needed.    [provider]  cetirizine (ZYRTEC) 10 MG tablet Take 1 tablet (10 mg total) by mouth daily. 10/08/11   Leandrew Koyanagi, MD  Cholecalciferol (VITAMIN D3) 2000 UNITS capsule Take 2,000 Units by mouth daily.    [provider]  fluticasone (FLONASE) 50 MCG/ACT nasal spray Place 2 sprays into the nose every morning. NEED REFILLS 10/08/11   Leandrew Koyanagi, MD  levothyroxine (SYNTHROID, LEVOTHROID) 75 MCG tablet Take 75 mcg by mouth daily. 06/24/15   [provider]  metFORMIN (GLUCOPHAGE-XR) 500 MG 24 hr tablet Take 1,000 mg by mouth in the morning and at bedtime. 12/14/13   [provider]  methocarbamol (ROBAXIN) 500 MG tablet Take 1 tablet (500 mg total) by mouth 2 (two) times daily. 08/02/21   Prosperi, Christian H, PA-C  montelukast (SINGULAIR) 10 MG tablet Take one tablet by mouth daily.     Leandrew Koyanagi, MD  olopatadine (PATANOL) 0.1 % ophthalmic solution 1 drop 2 (two) times daily. 02/02/20   [provider]  omeprazole (PRILOSEC) 40 MG capsule Take 1 capsule (40 mg total) by mouth daily. 12/02/19   Nandigam, Venia Minks, MD  PROAIR HFA 108 (90 BASE) MCG/ACT inhaler INHALE 2 PUFFS 4 TIMES A DAY AS NEEDED...NEEDS OFFICE VISIT FOR MORE 12/11/12   Rise Mu, PA-C  rosuvastatin (CRESTOR) 10 MG tablet TAKE 1 TABLET BY MOUTH EVERY DAY 02/17/22   O'Neal, Cassie Freer, MD  sucralfate (CARAFATE) 1 g tablet Take 1 tablet (1 g total) by mouth 4 (four) times daily -  with meals and at bedtime. Patient taking differently: Take 1 g by mouth as needed. 12/02/19   Mauri Pole, MD      Allergies    Celebrex [celecoxib], Meloxicam, Procaine hcl, and Penicillins    Review of Systems   Review of Systems  Respiratory:  Negative for shortness of breath.   Cardiovascular:  Positive for chest pain.  Gastrointestinal:  Negative for abdominal pain, nausea and vomiting.  Musculoskeletal:  Positive for arthralgias.  Psychiatric/Behavioral:  The patient is nervous/anxious.     Physical Exam Updated Vital Signs BP 101/81   Pulse 69   Temp (!) 97 F (36.1 C)   Resp 16   LMP 06/09/2012   SpO2 100%  Physical Exam Vitals and nursing note reviewed.  Constitutional:  General: She is not in acute distress.    Appearance: She is well-developed.  HENT:     Head: Normocephalic and atraumatic.  Eyes:     Conjunctiva/sclera: Conjunctivae normal.  Cardiovascular:     Rate and Rhythm: Normal rate and regular rhythm.     Heart sounds: No murmur heard. Pulmonary:     Effort: Pulmonary effort is normal. No respiratory distress.     Breath sounds: Normal breath sounds.  Abdominal:     Palpations: Abdomen is soft.     Tenderness: There is no abdominal tenderness.  Musculoskeletal:        General: No swelling.     Cervical back: Neck supple.  Skin:    General: Skin is warm  and dry.     Capillary Refill: Capillary refill takes less than 2 seconds.  Neurological:     Mental Status: She is alert.  Psychiatric:        Mood and Affect: Mood normal.     ED Results / Procedures / Treatments   Labs (all labs ordered are listed, but only abnormal results are displayed) Labs Reviewed  BASIC METABOLIC PANEL - Abnormal; Notable for the following components:      Result Value   Glucose, Bld 107 (*)    All other components within normal limits  CBC  TROPONIN I (HIGH SENSITIVITY)  TROPONIN I (HIGH SENSITIVITY)    EKG None  Radiology DG Chest 2 View  Result Date: 06/09/2022 CLINICAL DATA:  Chest pain EXAM: CHEST - 2 VIEW COMPARISON:  08/02/2021 FINDINGS: Cardiac size is within normal limits. There is air-fluid level in retrocardiac region. There are no signs of pulmonary edema or focal pulmonary consolidation. There is no pleural effusion or pneumothorax. IMPRESSION: There are no signs of pulmonary edema or focal pulmonary consolidation. Moderate sized fixed hiatal hernia. Electronically Signed   By: Elmer Picker M.D.   On: 06/09/2022 17:43    Procedures Procedures    Medications Ordered in ED Medications - No data to display  ED Course/ Medical Decision Making/ A&P                           Medical Decision Making Amount and/or Complexity of Data Reviewed Labs: ordered. Radiology: ordered.   This patient presents to the ED for concern of chest pain, this involves an extensive number of treatment options, and is a complaint that carries with it a high risk of complications and morbidity.  The differential diagnosis includes ACS, PE, dissection, pneumonia, musculoskeletal pain, GERD, and others   Co morbidities that complicate the patient evaluation  History of anxiety, GERD, hiatal hernia   Additional history obtained:   External records from outside source obtained and reviewed including cardiology notes from February for follow-up due  to family history of heart disease and anxiety   Lab Tests:  I Ordered, and personally interpreted labs.  The pertinent results include: Initial and repeat troponins less than 2, unremarkable CBC, unremarkable BMP   Imaging Studies ordered:  I ordered imaging studies including chest x-ray I independently visualized and interpreted imaging which showed  There are no signs of pulmonary edema or focal pulmonary  consolidation. Moderate sized fixed hiatal hernia.   I agree with the radiologist interpretation   Cardiac Monitoring: / EKG:  The patient was maintained on a cardiac monitor.  I personally viewed and interpreted the cardiac monitored which showed an underlying rhythm of: Normal sinus rhythm  Test / Admission - Considered:  The patient's troponins have both been less than 2.  She has no ischemic changes on EKG.  Very low clinical suspicion of ACS.  No shortness of breath or tachycardia to suggest PE.  No recent surgery, travel, no smoking or cancer history as risk factors.  No clinical signs of dissection.  No pneumonia on chest x-ray.  At this time unclear etiology of patient's pain but patient's pain is mild to moderate in nature and does not appear to be cardiac at this time.  Plan to discharge patient home with recommendations for follow-up with cardiology.  Patient states she plans to contact cardiology herself tomorrow to schedule a follow-up appointment.        Final Clinical Impression(s) / ED Diagnoses Final diagnoses:  Chest pain, unspecified type  Anxiety  Hiatal hernia    Rx / DC Orders ED Discharge Orders     None         Ronny Bacon 06/09/22 2111    Tretha Sciara, MD 06/10/22 [phone]

## 2022-06-12 ENCOUNTER — Telehealth: Payer: Self-pay | Admitting: Cardiovascular Disease

## 2022-06-12 NOTE — Telephone Encounter (Signed)
Left message for pt to call.

## 2022-06-12 NOTE — Telephone Encounter (Signed)
Spoke with pt, aware all of her evaluation at the ER was normal. She is aware everything that was checked in the ER was normal. Follow up appointment made next available in April. Patient will call and cancel that appointment if not needed when it gets closer.

## 2022-06-12 NOTE — Telephone Encounter (Signed)
Pt calling to sch referral f/u. Informed her the first I have is 09/15/22. I offered APP and waitlist however she would like to speak to someone about if this is urgent or not because she thought she was having a heart attack. Please advise.

## 2022-07-18 ENCOUNTER — Other Ambulatory Visit: Payer: Self-pay | Admitting: Gastroenterology

## 2022-07-18 DIAGNOSIS — K449 Diaphragmatic hernia without obstruction or gangrene: Secondary | ICD-10-CM

## 2022-07-18 DIAGNOSIS — R109 Unspecified abdominal pain: Secondary | ICD-10-CM

## 2022-08-04 ENCOUNTER — Inpatient Hospital Stay (HOSPITAL_COMMUNITY): Admission: RE | Admit: 2022-08-04 | Payer: BC Managed Care – PPO | Source: Ambulatory Visit

## 2022-08-15 ENCOUNTER — Other Ambulatory Visit: Payer: BC Managed Care – PPO

## 2022-09-18 ENCOUNTER — Ambulatory Visit: Payer: BC Managed Care – PPO | Admitting: Cardiovascular Disease

## 2022-11-26 ENCOUNTER — Ambulatory Visit
Admission: RE | Admit: 2022-11-26 | Discharge: 2022-11-26 | Disposition: A | Discharge status: Home / Self Care | Payer: BC Managed Care – PPO | Source: Ambulatory Visit | Attending: Family Medicine | Admitting: Family Medicine

## 2022-11-26 ENCOUNTER — Other Ambulatory Visit: Payer: Self-pay | Admitting: Family Medicine

## 2022-11-26 DIAGNOSIS — M25552 Pain in left hip: Secondary | ICD-10-CM

## 2022-12-03 ENCOUNTER — Encounter: Payer: Self-pay | Admitting: Vascular Surgery

## 2022-12-03 ENCOUNTER — Ambulatory Visit: Payer: BC Managed Care – PPO | Admitting: Vascular Surgery

## 2022-12-03 VITALS — BP 112/77 | HR 73 | Temp 97.6°F | Wt 175.0 lb

## 2022-12-03 DIAGNOSIS — I872 Venous insufficiency (chronic) (peripheral): Secondary | ICD-10-CM

## 2022-12-03 NOTE — Progress Notes (Signed)
ASSESSMENT & PLAN   CHRONIC VENOUS DISEASE: This patient has CEAP C1 venous disease (telangiectasias).  We have discussed the importance of daily leg elevation and the proper positioning for this.  I have encouraged her to avoid prolonged sitting and standing.  We discussed the importance of exercise specifically walking and water aerobics.  I also recommended knee-high compression stockings with a gradient of 15 to 20 mmHg.  If she would like to have sclerotherapy I think it would probably be best to get formal venous reflux testing first.  The left leg seems to be the worst of the 2 legs so I would start on the left.  She will call if she would like to schedule formal venous reflux testing.  I think she would be a candidate for sclerotherapy in [name], RN did take a quick look at her legs and agreed that she would be a good candidate.  However again I think I would start with formal venous reflux testing.  She will call if she elects to proceed with this.  REASON FOR CONSULT:    Painful varicose veins.  The consult is requested by Dr. Darrow Bussing.   HPI:   Wendy Curtis is a 62 y.o. female who is referred for evaluation of varicose veins.  The patient has a long history of some varicose veins in both legs.  She states that her veins itch at times.  She occasionally gets some throbbing in her legs when she has been standing for a long period of time.  She does elevate her legs.  She does not like compression stockings as they are not comfortable and they are too hot.  She denies any previous history of DVT.  She has had no previous venous procedures.  Past Medical History:  Diagnosis Date   Allergic rhinitis    Allergy    seasonal allergies   Anemia    hx of   Anxiety    hx of    Arthritis    Asthma    started in 20's   Depression    hx of   DM (diabetes mellitus) ([facility])    on meds   Esophageal stricture    Fundic gland polyps of stomach, benign    GERD (gastroesophageal  reflux disease)    on meds   Hepatic steatosis    Hiatal hernia    Hyperlipidemia    on meds   Hypothyroid    on meds   Kidney stones    hx of kidney stones   Osteoarthritis    Panic disorder    when traveling   Psoriasis    per patient     Family History  Problem Relation Age of Onset   Rheum arthritis Sister    Diabetes Mother    Stomach cancer Brother        drinker   Alcohol abuse Brother    Diabetes Brother    Alcohol abuse Brother    Arthritis Other        through out the family   Diabetes Sister    Heart attack Sister    Arthritis Sister    Rheum arthritis Sister    Heart attack Sister 32   Rheum arthritis Niece    Colon cancer Neg Hx    Colon polyps Neg Hx    Esophageal cancer Neg Hx    Rectal cancer Neg Hx     SOCIAL HISTORY: Social History   Tobacco Use   Smoking  status: Former    Packs/day: 0.25    Years: 20.00    Additional pack years: 0.00    Total pack years: 5.00    Types: Cigarettes   Smokeless tobacco: Never  Substance Use Topics   Alcohol use: Yes    Alcohol/week: 0.0 - 1.0 standard drinks of alcohol    Comment: occ    Allergies  Allergen Reactions   Celebrex [Celecoxib]     Severe GI upset (pain)   Meloxicam     Tongue sweeling   Procaine Hcl Other (See Comments)    Jittery, sweating   Penicillins Rash    Has patient had a PCN reaction causing immediate rash, facial/tongue/throat swelling, SOB or lightheadedness with hypotension: unknown Has patient had a PCN reaction causing severe rash involving mucus membranes or skin necrosis: unknown Has patient had a PCN reaction that required hospitalization no Has patient had a PCN reaction occurring within the last 10 years: no If all of the above answers are "NO", then may proceed with Cephalosporin use.     Current Outpatient Medications  Medication Sig Dispense Refill   ALPRAZolam (XANAX PO) Take 1 tablet by mouth as needed.     cetirizine (ZYRTEC) 10 MG tablet Take 1 tablet  (10 mg total) by mouth daily. 90 tablet 3   Cholecalciferol (VITAMIN D3) 2000 UNITS capsule Take 2,000 Units by mouth daily.     fluticasone (FLONASE) 50 MCG/ACT nasal spray Place 2 sprays into the nose every morning. NEED REFILLS     levothyroxine (SYNTHROID, LEVOTHROID) 75 MCG tablet Take 75 mcg by mouth daily.  1   metFORMIN (GLUCOPHAGE-XR) 500 MG 24 hr tablet Take 1,000 mg by mouth in the morning and at bedtime.     methocarbamol (ROBAXIN) 500 MG tablet Take 1 tablet (500 mg total) by mouth 2 (two) times daily. 20 tablet 0   montelukast (SINGULAIR) 10 MG tablet Take one tablet by mouth daily. 30 tablet 2   olopatadine (PATANOL) 0.1 % ophthalmic solution 1 drop 2 (two) times daily.     omeprazole (PRILOSEC) 40 MG capsule Take 1 capsule (40 mg total) by mouth daily. 30 capsule 11   PROAIR HFA 108 (90 BASE) MCG/ACT inhaler INHALE 2 PUFFS 4 TIMES A DAY AS NEEDED...NEEDS OFFICE VISIT FOR MORE 8.5 each 1   rosuvastatin (CRESTOR) 10 MG tablet TAKE 1 TABLET BY MOUTH EVERY DAY 90 tablet 3   sucralfate (CARAFATE) 1 g tablet Take 1 tablet (1 g total) by mouth 4 (four) times daily -  with meals and at bedtime. (Patient taking differently: Take 1 g by mouth as needed.) 120 tablet 3   No current facility-administered medications for this visit.    REVIEW OF SYSTEMS:  [X]  denotes positive finding, [ ]  denotes negative finding Cardiac  Comments:  Chest pain or chest pressure:    Shortness of breath upon exertion:    Short of breath when lying flat:    Irregular heart rhythm:        Vascular    Pain in calf, thigh, or hip brought on by ambulation:    Pain in feet at night that wakes you up from your sleep:     Blood clot in your veins:    Leg swelling:         Pulmonary    Oxygen at home:    Productive cough:     Wheezing:         Neurologic    Sudden weakness in arms  or legs:     Sudden numbness in arms or legs:     Sudden onset of difficulty speaking or slurred speech:    Temporary loss  of vision in one eye:     Problems with dizziness:         Gastrointestinal    Blood in stool:     Vomited blood:         Genitourinary    Burning when urinating:     Blood in urine:        Psychiatric    Major depression:         Hematologic    Bleeding problems:    Problems with blood clotting too easily:        Skin    Rashes or ulcers:        Constitutional    Fever or chills:    -  PHYSICAL EXAM:   Vitals:   12/03/22 1319  BP: 112/77  Pulse: 73  Temp: 97.6 F (36.4 C)  TempSrc: Temporal  SpO2: 99%  Weight: 175 lb (79.4 kg)   Body mass index is 37.87 kg/m. GENERAL: The patient is a well-nourished female, in no acute distress. The vital signs are documented above. CARDIAC: There is a regular rate and rhythm.  VASCULAR: I do not detect carotid bruits. She has palpable dorsalis pedis pulses. She has some reticular veins and spider veins bilaterally.       I did look at both great saphenous veins myself with the Sono[employer].  They were not especially dilate  The left great saphenous vein was larger than the right.  It was dilated up to proximately 3.5 mm. PULMONARY: There is good air exchange bilaterally without wheezing or rales. ABDOMEN: Soft and non-tender with normal pitched bowel sounds.  MUSCULOSKELETAL: There are no major deformities. NEUROLOGIC: No focal weakness or paresthesias are detected. SKIN: There are no ulcers or rashes noted. PSYCHIATRIC: The patient has a normal affect.  DATA:    She did not have formal venous reflux testing.  Waverly [facility]

## 2023-01-07 ENCOUNTER — Encounter: Payer: Self-pay | Admitting: Physical Therapy

## 2023-01-07 ENCOUNTER — Ambulatory Visit (INDEPENDENT_AMBULATORY_CARE_PROVIDER_SITE_OTHER): Payer: BC Managed Care – PPO | Admitting: Physical Therapy

## 2023-01-07 ENCOUNTER — Ambulatory Visit (INDEPENDENT_AMBULATORY_CARE_PROVIDER_SITE_OTHER): Payer: BC Managed Care – PPO | Admitting: Orthopaedic Surgery

## 2023-01-07 DIAGNOSIS — M25552 Pain in left hip: Secondary | ICD-10-CM

## 2023-01-07 DIAGNOSIS — M25852 Other specified joint disorders, left hip: Secondary | ICD-10-CM | POA: Diagnosis not present

## 2023-01-07 NOTE — Progress Notes (Signed)
Chief Complaint: Bilateral hip pain, back pain, shoulder pain     History of Present Illness:    Wendy Curtis is a 62 y.o. female presents today with bilateral hip pain with left worse than right.  She states that the hip pain that initially began on the right although now is worse than the left.  She does have a history of psoriasis with multiple areas of musculoskeletal pain although the hips certainly have been a big component of her pain.  She has recently begun yoga to improve range of motion about the hip which is helping significantly.  She is a member here at [facility] [employer].  She has not done formal physical therapy in the past.  She states that the pain is bothersome during activity.  She has never had any injections of this left hip.    Surgical History:   None  PMH/PSH/Family History/Social History/Meds/Allergies:    Past Medical History:  Diagnosis Date   Allergic rhinitis    Allergy    seasonal allergies   Anemia    hx of   Anxiety    hx of    Arthritis    Asthma    started in 20's   Depression    hx of   DM (diabetes mellitus) (HCC)    on meds   Esophageal stricture    Fundic gland polyps of stomach, benign    GERD (gastroesophageal reflux disease)    on meds   Hepatic steatosis    Hiatal hernia    Hyperlipidemia    on meds   Hypothyroid    on meds   Kidney stones    hx of kidney stones   Osteoarthritis    Panic disorder    when traveling   Psoriasis    per patient    Past Surgical History:  Procedure Laterality Date   CHOLECYSTECTOMY     COLONOSCOPY  2011   DB-normal    DILATION AND CURETTAGE OF UTERUS     sinsus surgery  1996-1997   sinsus drainage    TONSILLECTOMY     Social History   Socioeconomic History   Marital status: Married    Spouse name: Not on file   Number of children: 0   Years of education: Not on file   Highest education level: Not on file  Occupational History    Occupation: TEACHER    Employer: [employer]  Tobacco Use   Smoking status: Former    Current packs/day: 0.25    Average packs/day: 0.3 packs/day for 20.0 years (5.0 ttl pk-yrs)    Types: Cigarettes   Smokeless tobacco: Never  Vaping Use   Vaping status: Never Used  Substance and Sexual Activity   Alcohol use: Yes    Alcohol/week: 0.0 - 1.0 standard drinks of alcohol    Comment: occ   Drug use: No   Sexual activity: Not Currently    Partners: Male    Comment: husband vasectomy  Other Topics Concern   Not on file  Social History Narrative   Not on file   Social Determinants of Health   Financial Resource Strain: Not on file  Food Insecurity: Not on file  Transportation Needs: Not on file  Physical Activity: Not on file  Stress: Not on file  Social Connections: Not  on file   Family History  Problem Relation Age of Onset   Rheum arthritis Sister    Diabetes Mother    Stomach cancer Brother        drinker   Alcohol abuse Brother    Diabetes Brother    Alcohol abuse Brother    Arthritis Other        through out the family   Diabetes Sister    Heart attack Sister    Arthritis Sister    Rheum arthritis Sister    Heart attack Sister 62   Rheum arthritis Niece    Colon cancer Neg Hx    Colon polyps Neg Hx    Esophageal cancer Neg Hx    Rectal cancer Neg Hx    Allergies  Allergen Reactions   Celebrex [Celecoxib]     Severe GI upset (pain)   Meloxicam     Tongue sweeling   Procaine Hcl Other (See Comments)    Jittery, sweating   Penicillins Rash    Has patient had a PCN reaction causing immediate rash, facial/tongue/throat swelling, SOB or lightheadedness with hypotension: unknown Has patient had a PCN reaction causing severe rash involving mucus membranes or skin necrosis: unknown Has patient had a PCN reaction that required hospitalization no Has patient had a PCN reaction occurring within the last 10 years: no If all of the above answers are  "NO", then may proceed with Cephalosporin use.    Current Outpatient Medications  Medication Sig Dispense Refill   ALPRAZolam (XANAX PO) Take 1 tablet by mouth as needed.     cetirizine (ZYRTEC) 10 MG tablet Take 1 tablet (10 mg total) by mouth daily. 90 tablet 3   Cholecalciferol (VITAMIN D3) 2000 UNITS capsule Take 2,000 Units by mouth daily.     fluticasone (FLONASE) 50 MCG/ACT nasal spray Place 2 sprays into the nose every morning. NEED REFILLS     levothyroxine (SYNTHROID, LEVOTHROID) 75 MCG tablet Take 75 mcg by mouth daily.  1   metFORMIN (GLUCOPHAGE-XR) 500 MG 24 hr tablet Take 1,000 mg by mouth in the morning and at bedtime.     methocarbamol (ROBAXIN) 500 MG tablet Take 1 tablet (500 mg total) by mouth 2 (two) times daily. 20 tablet 0   montelukast (SINGULAIR) 10 MG tablet Take one tablet by mouth daily. 30 tablet 2   olopatadine (PATANOL) 0.1 % ophthalmic solution 1 drop 2 (two) times daily.     omeprazole (PRILOSEC) 40 MG capsule Take 1 capsule (40 mg total) by mouth daily. 30 capsule 11   PROAIR HFA 108 (90 BASE) MCG/ACT inhaler INHALE 2 PUFFS 4 TIMES A DAY AS NEEDED...NEEDS OFFICE VISIT FOR MORE 8.5 each 1   rosuvastatin (CRESTOR) 10 MG tablet TAKE 1 TABLET BY MOUTH EVERY DAY 90 tablet 3   sucralfate (CARAFATE) 1 g tablet Take 1 tablet (1 g total) by mouth 4 (four) times daily -  with meals and at bedtime. (Patient taking differently: Take 1 g by mouth as needed.) 120 tablet 3   No current facility-administered medications for this visit.   No results found.  Review of Systems:   A ROS was performed including pertinent positives and negatives as documented in the HPI.  Physical Exam :   Constitutional: NAD and appears stated age Neurological: Alert and oriented Psych: Appropriate affect and cooperative Last menstrual period 06/09/2012.   Comprehensive Musculoskeletal Exam:    Bilateral hip range of motion is 30 degrees internal rotation 45 external rotation.  Both of  this creates pain in the groin area particularly with positive FADIR on the left more so than the right.  Negative straight leg raise.  Distal neurosensory exam is intact  Imaging:   Xray (AP pelvis, 3 views right hip, 3 views left hip): Pincer impingement bilaterally with preserved femoral acetabular joints    I personally reviewed and interpreted the radiographs.   Assessment:   62 y.o. female with evidence of bilateral pincer impingement.  Overall I do believe that she would benefit significantly from an injection in this left hip with ultrasound.  She would like to plan to schedule this for a different day.  In the meantime we will plan to get her connected with physical therapy to work on range of motion and strengthening of the core and hips.  I will plan to see her back for left hip injection on the subsequent day  Plan :    -Plan for return to clinic for left hip ultrasound-guided injection     I personally saw and evaluated the patient, and participated in the management and treatment plan.  Huel Cote, MD Attending Physician, Orthopedic Surgery  This document was dictated using Dragon voice recognition software. A reasonable attempt at proof reading has been made to minimize errors.

## 2023-01-07 NOTE — Therapy (Signed)
  OUTPATIENT PHYSICAL THERAPY SCREEN @[address]   Patient Name: Wendy Curtis MRN: [id number] DOB:[birth year], 62 y.o., female Today's Date: 01/07/2023  END OF SESSION:  PT End of Session - 01/07/23 1104     Visit Number 1    Activity Tolerance Patient tolerated treatment well    Behavior During Therapy [facility] for tasks assessed/performed             Past Medical History:  Diagnosis Date   Allergic rhinitis    Allergy    seasonal allergies   Anemia    hx of   Anxiety    hx of    Arthritis    Asthma    started in 20's   Depression    hx of   DM (diabetes mellitus) (HCC)    on meds   Esophageal stricture    Fundic gland polyps of stomach, benign    GERD (gastroesophageal reflux disease)    on meds   Hepatic steatosis    Hiatal hernia    Hyperlipidemia    on meds   Hypothyroid    on meds   Kidney stones    hx of kidney stones   Osteoarthritis    Panic disorder    when traveling   Psoriasis    per patient    Past Surgical History:  Procedure Laterality Date   CHOLECYSTECTOMY     COLONOSCOPY  2011   DB-normal    DILATION AND CURETTAGE OF UTERUS     sinsus surgery  1996-1997   sinsus drainage    TONSILLECTOMY     Patient Active Problem List   Diagnosis Date Noted   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   Osteoarthritis of both knees 05/13/2013   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) 09/13/2011   BMI 40.0-44.9, adult (HCC) 09/12/2011   Type II or unspecified type diabetes mellitus without mention of complication, uncontrolled 09/12/2011   Allergic rhinitis 09/12/2011   Asthma 09/12/2011   Chronic urticaria 09/12/2011   Nephrolithiasis 09/12/2011   ANXIETY 08/27/2009   PANIC DISORDER 08/27/2009   DEPRESSION 08/27/2009   ESOPHAGEAL STRICTURE 08/27/2009   GERD 08/27/2009   HIATAL HERNIA 08/27/2009   SLEEP APNEA 08/27/2009     THERAPY DIAG:  Pain in left hip  Goal of screen:  This patient was referred to Physical Therapy specialty  screen by Huel Cote, MD for education for preparation of rehabilitation POC. Bil hip pincer, psoriatic arthritis. Would like to progress to aquatic sessions.   [facility] HEP code:  [email or web address]  Clinical Impression & Plan:  Pt is a member at [employer] and does yoga. Significant tightness in LEft glut med and piriformis- some STM performed today to left side. Encouraged her to consider massage therapy as a long term maintenance option given her activity level. Disucssed dry needling as an option and that we will start PT on land in order to focus on decreasing spasm and improving mechanics to progress toward aquatic exercise training.    Army Fossa PT, DPT 01/07/2023, 11:05 AM  [address] [place], [place] [address] [phone]   Note: charges not applied for screen.

## 2023-01-12 ENCOUNTER — Encounter (HOSPITAL_BASED_OUTPATIENT_CLINIC_OR_DEPARTMENT_OTHER): Payer: Self-pay

## 2023-01-12 ENCOUNTER — Telehealth (HOSPITAL_BASED_OUTPATIENT_CLINIC_OR_DEPARTMENT_OTHER): Payer: Self-pay | Admitting: Orthopaedic Surgery

## 2023-01-12 ENCOUNTER — Other Ambulatory Visit (HOSPITAL_BASED_OUTPATIENT_CLINIC_OR_DEPARTMENT_OTHER): Payer: Self-pay | Admitting: Orthopaedic Surgery

## 2023-01-12 DIAGNOSIS — M25852 Other specified joint disorders, left hip: Secondary | ICD-10-CM

## 2023-01-12 NOTE — Telephone Encounter (Signed)
Patient wants to know if her referrals was sent to Physical therapy  down stairs. Also she wants to make sure that she sees a  Cpp provider([employer])  for Physical therapy if there is not a Cpp provider then she still would like to do water therapy anyway. Please contact patient [phone]

## 2023-01-12 NOTE — Telephone Encounter (Signed)
I called to Physical therapy I think his name was Sharia Reeve not sure of his name, Ask if whoever called to the patient to sch to speak with her about CPP for [employer]. He stated that she would need to know there cpt codes and I informed him that we don't have that information to let the patient know when they sch her and she will decided

## 2023-01-14 ENCOUNTER — Ambulatory Visit (HOSPITAL_BASED_OUTPATIENT_CLINIC_OR_DEPARTMENT_OTHER): Payer: BC Managed Care – PPO | Admitting: Orthopaedic Surgery

## 2023-01-14 DIAGNOSIS — M25552 Pain in left hip: Secondary | ICD-10-CM

## 2023-01-14 DIAGNOSIS — M25852 Other specified joint disorders, left hip: Secondary | ICD-10-CM

## 2023-01-14 MED ORDER — LIDOCAINE HCL 1 % IJ SOLN
4.0000 mL | INTRAMUSCULAR | Status: AC | PRN
  Administered 2023-01-14: 4 mL

## 2023-01-14 MED ORDER — TRIAMCINOLONE ACETONIDE 40 MG/ML IJ SUSP
80.0000 mg | INTRAMUSCULAR | Status: AC | PRN
  Administered 2023-01-14: 80 mg via INTRA_ARTICULAR

## 2023-01-14 NOTE — Progress Notes (Signed)
Chief Complaint: Bilateral hip pain, back pain, shoulder pain     History of Present Illness:   01/14/2023: Today for follow-up and for a left hip injection.  Wendy Curtis is a 62 y.o. female presents today with bilateral hip pain with left worse than right.  She states that the hip pain that initially began on the right although now is worse than the left.  She does have a history of psoriasis with multiple areas of musculoskeletal pain although the hips certainly have been a big component of her pain.  She has recently begun yoga to improve range of motion about the hip which is helping significantly.  She is a member here at [facility] [employer].  She has not done formal physical therapy in the past.  She states that the pain is bothersome during activity.  She has never had any injections of this left hip.    Surgical History:   None  PMH/PSH/Family History/Social History/Meds/Allergies:    Past Medical History:  Diagnosis Date  . Allergic rhinitis   . Allergy    seasonal allergies  . Anemia    hx of  . Anxiety    hx of   . Arthritis   . Asthma    started in 20's  . Depression    hx of  . DM (diabetes mellitus) (HCC)    on meds  . Esophageal stricture   . Fundic gland polyps of stomach, benign   . GERD (gastroesophageal reflux disease)    on meds  . Hepatic steatosis   . Hiatal hernia   . Hyperlipidemia    on meds  . Hypothyroid    on meds  . Kidney stones    hx of kidney stones  . Osteoarthritis   . Panic disorder    when traveling  . Psoriasis    per patient    Past Surgical History:  Procedure Laterality Date  . CHOLECYSTECTOMY    . COLONOSCOPY  2011   DB-normal   . DILATION AND CURETTAGE OF UTERUS    . sinsus surgery  1996-1997   sinsus drainage   . TONSILLECTOMY     Social History   Socioeconomic History  . Marital status: Married    Spouse name: Not on file  . Number of children: 0  . Years of education:  Not on file  . Highest education level: Not on file  Occupational History  . Occupation: Magazine features editor: [employer]  Tobacco Use  . Smoking status: Former    Current packs/day: 0.25    Average packs/day: 0.3 packs/day for 20.0 years (5.0 ttl pk-yrs)    Types: Cigarettes  . Smokeless tobacco: Never  Vaping Use  . Vaping status: Never Used  Substance and Sexual Activity  . Alcohol use: Yes    Alcohol/week: 0.0 - 1.0 standard drinks of alcohol    Comment: occ  . Drug use: No  . Sexual activity: Not Currently    Partners: Male    Comment: husband vasectomy  Other Topics Concern  . Not on file  Social History Narrative  . Not on file   Social Determinants of Health   Financial Resource Strain: Not on file  Food Insecurity: Not on file  Transportation Needs: Not on file  Physical Activity: Not on  file  Stress: Not on file  Social Connections: Not on file   Family History  Problem Relation Age of Onset  . Rheum arthritis Sister   . Diabetes Mother   . Stomach cancer Brother        drinker  . Alcohol abuse Brother   . Diabetes Brother   . Alcohol abuse Brother   . Arthritis Other        through out the family  . Diabetes Sister   . Heart attack Sister   . Arthritis Sister   . Rheum arthritis Sister   . Heart attack Sister 47  . Rheum arthritis Niece   . Colon cancer Neg Hx   . Colon polyps Neg Hx   . Esophageal cancer Neg Hx   . Rectal cancer Neg Hx    Allergies  Allergen Reactions  . Celebrex [Celecoxib]     Severe GI upset (pain)  . Meloxicam     Tongue sweeling  . Procaine Hcl Other (See Comments)    Jittery, sweating  . Penicillins Rash    Has patient had a PCN reaction causing immediate rash, facial/tongue/throat swelling, SOB or lightheadedness with hypotension: unknown Has patient had a PCN reaction causing severe rash involving mucus membranes or skin necrosis: unknown Has patient had a PCN reaction that required hospitalization  no Has patient had a PCN reaction occurring within the last 10 years: no If all of the above answers are "NO", then may proceed with Cephalosporin use.    Current Outpatient Medications  Medication Sig Dispense Refill  . ALPRAZolam (XANAX PO) Take 1 tablet by mouth as needed.    . cetirizine (ZYRTEC) 10 MG tablet Take 1 tablet (10 mg total) by mouth daily. 90 tablet 3  . Cholecalciferol (VITAMIN D3) 2000 UNITS capsule Take 2,000 Units by mouth daily.    . fluticasone (FLONASE) 50 MCG/ACT nasal spray Place 2 sprays into the nose every morning. NEED REFILLS    . levothyroxine (SYNTHROID, LEVOTHROID) 75 MCG tablet Take 75 mcg by mouth daily.  1  . metFORMIN (GLUCOPHAGE-XR) 500 MG 24 hr tablet Take 1,000 mg by mouth in the morning and at bedtime.    . methocarbamol (ROBAXIN) 500 MG tablet Take 1 tablet (500 mg total) by mouth 2 (two) times daily. 20 tablet 0  . montelukast (SINGULAIR) 10 MG tablet Take one tablet by mouth daily. 30 tablet 2  . olopatadine (PATANOL) 0.1 % ophthalmic solution 1 drop 2 (two) times daily.    Marland Kitchen omeprazole (PRILOSEC) 40 MG capsule Take 1 capsule (40 mg total) by mouth daily. 30 capsule 11  . PROAIR HFA 108 (90 BASE) MCG/ACT inhaler INHALE 2 PUFFS 4 TIMES A DAY AS NEEDED...NEEDS OFFICE VISIT FOR MORE 8.5 each 1  . rosuvastatin (CRESTOR) 10 MG tablet TAKE 1 TABLET BY MOUTH EVERY DAY 90 tablet 3  . sucralfate (CARAFATE) 1 g tablet Take 1 tablet (1 g total) by mouth 4 (four) times daily -  with meals and at bedtime. (Patient taking differently: Take 1 g by mouth as needed.) 120 tablet 3   No current facility-administered medications for this visit.   No results found.  Review of Systems:   A ROS was performed including pertinent positives and negatives as documented in the HPI.  Physical Exam :   Constitutional: NAD and appears stated age Neurological: Alert and oriented Psych: Appropriate affect and cooperative Last menstrual period 06/09/2012.   Comprehensive  Musculoskeletal Exam:    Bilateral hip range  of motion is 30 degrees internal rotation 45 external rotation.  Both of this creates pain in the groin area particularly with positive FADIR on the left more so than the right.  Negative straight leg raise.  Distal neurosensory exam is intact  Imaging:   Xray (AP pelvis, 3 views right hip, 3 views left hip): Pincer impingement bilaterally with preserved femoral acetabular joints    I personally reviewed and interpreted the radiographs.   Assessment:   62 y.o. female with evidence of bilateral pincer impingement.  Overall I do believe that she would benefit significantly from an injection in this left hip with ultrasound.  Plan :    -Left hip ultrasound-guided injection performed after verbal consent obtained    Procedure Note  Patient: Wendy Curtis             Date of Birth: 04-06-1961           MRN: [id number]             Visit Date: 01/14/2023  Procedures: Visit Diagnoses: No diagnosis found.  Large Joint Inj: L hip joint on 01/14/2023 4:59 PM Indications: pain Details: 22 G 3.5 in needle, ultrasound-guided anterolateral approach  Arthrogram: No  Medications: 4 mL lidocaine 1 %; 80 mg triamcinolone acetonide 40 MG/ML Outcome: tolerated well, no immediate complications Procedure, treatment alternatives, risks and benefits explained, specific risks discussed. Consent was given by the patient. Immediately prior to procedure a time out was called to verify the correct patient, procedure, equipment, support staff and site/side marked as required. Patient was prepped and draped in the usual sterile fashion.       I personally saw and evaluated the patient, and participated in the management and treatment plan.  Huel Cote, MD Attending Physician, Orthopedic Surgery  This document was dictated using Dragon voice recognition software. A reasonable attempt at proof reading has been made to minimize errors.

## 2023-01-18 ENCOUNTER — Encounter (HOSPITAL_BASED_OUTPATIENT_CLINIC_OR_DEPARTMENT_OTHER): Payer: Self-pay | Admitting: Orthopaedic Surgery

## 2023-02-16 ENCOUNTER — Ambulatory Visit (HOSPITAL_BASED_OUTPATIENT_CLINIC_OR_DEPARTMENT_OTHER): Payer: BC Managed Care – PPO | Admitting: Physical Therapy

## 2023-02-25 ENCOUNTER — Other Ambulatory Visit: Payer: Self-pay | Admitting: Family Medicine

## 2023-02-25 DIAGNOSIS — Z1231 Encounter for screening mammogram for malignant neoplasm of breast: Secondary | ICD-10-CM

## 2023-04-03 ENCOUNTER — Ambulatory Visit
Admission: RE | Admit: 2023-04-03 | Discharge: 2023-04-03 | Disposition: A | Discharge status: Home / Self Care | Payer: BC Managed Care – PPO | Source: Ambulatory Visit | Attending: Family Medicine | Admitting: Family Medicine

## 2023-04-03 DIAGNOSIS — Z1231 Encounter for screening mammogram for malignant neoplasm of breast: Secondary | ICD-10-CM

## 2023-12-18 DIAGNOSIS — M1711 Unilateral primary osteoarthritis, right knee: Secondary | ICD-10-CM | POA: Diagnosis not present

## 2023-12-18 DIAGNOSIS — M170 Bilateral primary osteoarthritis of knee: Secondary | ICD-10-CM | POA: Diagnosis not present

## 2023-12-18 DIAGNOSIS — M1712 Unilateral primary osteoarthritis, left knee: Secondary | ICD-10-CM | POA: Diagnosis not present

## 2023-12-18 DIAGNOSIS — M25552 Pain in left hip: Secondary | ICD-10-CM | POA: Diagnosis not present

## 2023-12-18 DIAGNOSIS — M25551 Pain in right hip: Secondary | ICD-10-CM | POA: Diagnosis not present

## 2023-12-29 DIAGNOSIS — B379 Candidiasis, unspecified: Secondary | ICD-10-CM | POA: Diagnosis not present

## 2023-12-29 DIAGNOSIS — L603 Nail dystrophy: Secondary | ICD-10-CM | POA: Diagnosis not present

## 2023-12-29 DIAGNOSIS — L218 Other seborrheic dermatitis: Secondary | ICD-10-CM | POA: Diagnosis not present

## 2023-12-29 DIAGNOSIS — B351 Tinea unguium: Secondary | ICD-10-CM | POA: Diagnosis not present

## 2024-01-14 DIAGNOSIS — H109 Unspecified conjunctivitis: Secondary | ICD-10-CM | POA: Diagnosis not present

## 2024-01-22 NOTE — Progress Notes (Signed)
 62 y.o. [id number] postmenopausal female here for annual exam. Married.  She reports ***. Urine sample provided: ***  Postmenopausal bleeding: *** Pelvic discharge or pain: *** Breast mass, nipple discharge or skin changes : *** Sexually active: ***   Last PAP:     Component Value Date/Time   DIAGPAP  01/26/2020 1653    - Negative for intraepithelial lesion or malignancy (NILM)   HPVHIGH Negative 01/26/2020 1653   ADEQPAP  01/26/2020 1653    Satisfactory for evaluation; transformation zone component PRESENT.   Last mammogram: 04/03/23 Density B, Bi-Rads 1 Neg Last DXA: 10/21/21 *** Last colonoscopy: 11/23/09 10 year recall  Exercising: *** Smoker:***  Flowsheet Row Nutrition from 02/11/2017 in [facility] Nutr Diab Ed  - A Dept Of [place]. [facility]  PHQ-2 Total Score 1       GYN HISTORY: ***  OB History  Gravida Para Term Preterm AB Living  2    2 0  SAB IAB Ectopic Multiple Live Births   2       # Outcome Date GA Lbr Len/2nd Weight Sex Type Anes PTL Lv  2 IAB           1 IAB            Past Medical History:  Diagnosis Date   Allergic rhinitis    Allergy    seasonal allergies   Anemia    hx of   Anxiety    hx of    Arthritis    Asthma    started in 20's   Depression    hx of   DM (diabetes mellitus) (HCC)    on meds   Esophageal stricture    Fundic gland polyps of stomach, benign    GERD (gastroesophageal reflux disease)    on meds   Hepatic steatosis    Hiatal hernia    Hyperlipidemia    on meds   Hypothyroid    on meds   Kidney stones    hx of kidney stones   Osteoarthritis    Panic disorder    when traveling   Psoriasis    per patient    Past Surgical History:  Procedure Laterality Date   CHOLECYSTECTOMY     COLONOSCOPY  2011   DB-normal    DILATION AND CURETTAGE OF UTERUS     sinsus surgery  1996-1997   sinsus drainage    TONSILLECTOMY     Current Outpatient Medications on File Prior to Visit  Medication Sig  Dispense Refill   ALPRAZolam  (XANAX  PO) Take 1 tablet by mouth as needed.     cetirizine  (ZYRTEC ) 10 MG tablet Take 1 tablet (10 mg total) by mouth daily. 90 tablet 3   Cholecalciferol (VITAMIN D3) 2000 UNITS capsule Take 2,000 Units by mouth daily.     fluticasone  (FLONASE ) 50 MCG/ACT nasal spray Place 2 sprays into the nose every morning. NEED REFILLS     levothyroxine (SYNTHROID, LEVOTHROID) 75 MCG tablet Take 75 mcg by mouth daily.  1   metFORMIN (GLUCOPHAGE-XR) 500 MG 24 hr tablet Take 1,000 mg by mouth in the morning and at bedtime.     methocarbamol  (ROBAXIN ) 500 MG tablet Take 1 tablet (500 mg total) by mouth 2 (two) times daily. 20 tablet 0   montelukast  (SINGULAIR ) 10 MG tablet Take one tablet by mouth daily. 30 tablet 2   olopatadine (PATANOL) 0.1 % ophthalmic solution 1 drop 2 (two) times daily.  omeprazole  (PRILOSEC ) 40 MG capsule Take 1 capsule (40 mg total) by mouth daily. 30 capsule 11   PROAIR HFA 108 (90 BASE) MCG/ACT inhaler INHALE 2 PUFFS 4 TIMES A DAY AS NEEDED...NEEDS OFFICE VISIT FOR MORE 8.5 each 1   rosuvastatin  (CRESTOR ) 10 MG tablet TAKE 1 TABLET BY MOUTH EVERY DAY 90 tablet 3   sucralfate  (CARAFATE ) 1 g tablet Take 1 tablet (1 g total) by mouth 4 (four) times daily -  with meals and at bedtime. (Patient taking differently: Take 1 g by mouth as needed.) 120 tablet 3   No current facility-administered medications on file prior to visit.   Social History   Socioeconomic History   Marital status: Married    Spouse name: Not on file   Number of children: 0   Years of education: Not on file   Highest education level: Not on file  Occupational History   Occupation: TEACHER    Employer: [employer]  Tobacco Use   Smoking status: Former    Current packs/day: 0.25    Average packs/day: 0.3 packs/day for 20.0 years (5.0 ttl pk-yrs)    Types: Cigarettes   Smokeless tobacco: Never  Vaping Use   Vaping status: Never Used  Substance and Sexual Activity    Alcohol use: Yes    Alcohol/week: 0.0 - 1.0 standard drinks of alcohol    Comment: occ   Drug use: No   Sexual activity: Not Currently    Partners: Male    Comment: husband vasectomy  Other Topics Concern   Not on file  Social History Narrative   Not on file   Social Drivers of Health   Financial Resource Strain: Not on file  Food Insecurity: Not on file  Transportation Needs: Not on file  Physical Activity: Not on file  Stress: Not on file  Social Connections: Not on file  Intimate Partner Violence: Not on file   Family History  Problem Relation Age of Onset   Rheum arthritis Sister    Diabetes Mother    Stomach cancer Brother        drinker   Alcohol abuse Brother    Diabetes Brother    Alcohol abuse Brother    Arthritis Other        through out the family   Diabetes Sister    Heart attack Sister    Arthritis Sister    Rheum arthritis Sister    Heart attack Sister 47   Rheum arthritis Niece    Colon cancer Neg Hx    Colon polyps Neg Hx    Esophageal cancer Neg Hx    Rectal cancer Neg Hx    Allergies  Allergen Reactions   Celebrex [Celecoxib]     Severe GI upset (pain)   Meloxicam     Tongue sweeling   Procaine Hcl Other (See Comments)    Jittery, sweating   Penicillins Rash    Has patient had a PCN reaction causing immediate rash, facial/tongue/throat swelling, SOB or lightheadedness with hypotension: unknown Has patient had a PCN reaction causing severe rash involving mucus membranes or skin necrosis: unknown Has patient had a PCN reaction that required hospitalization no Has patient had a PCN reaction occurring within the last 10 years: no If all of the above answers are NO, then may proceed with Cephalosporin use.       PE There were no vitals filed for this visit. There is no height or weight on file to calculate BMI.  Physical Exam    Assessment and Plan:        There are no diagnoses linked to this encounter. Clotilda FORBES Pa,  CMA

## 2024-01-25 ENCOUNTER — Encounter: Payer: Self-pay | Admitting: Obstetrics and Gynecology

## 2024-01-25 ENCOUNTER — Ambulatory Visit (INDEPENDENT_AMBULATORY_CARE_PROVIDER_SITE_OTHER): Payer: Self-pay | Admitting: Obstetrics and Gynecology

## 2024-01-25 ENCOUNTER — Other Ambulatory Visit (HOSPITAL_COMMUNITY)
Admission: RE | Admit: 2024-01-25 | Discharge: 2024-01-25 | Disposition: A | Discharge status: Home / Self Care | Source: Ambulatory Visit | Attending: Obstetrics and Gynecology | Admitting: Obstetrics and Gynecology

## 2024-01-25 VITALS — BP 112/64 | HR 69 | Temp 98.0°F | Ht 59.0 in | Wt 172.0 lb

## 2024-01-25 DIAGNOSIS — Z9189 Other specified personal risk factors, not elsewhere classified: Secondary | ICD-10-CM

## 2024-01-25 DIAGNOSIS — Z01419 Encounter for gynecological examination (general) (routine) without abnormal findings: Secondary | ICD-10-CM | POA: Insufficient documentation

## 2024-01-25 DIAGNOSIS — Z1151 Encounter for screening for human papillomavirus (HPV): Secondary | ICD-10-CM | POA: Diagnosis not present

## 2024-01-25 DIAGNOSIS — Z1331 Encounter for screening for depression: Secondary | ICD-10-CM

## 2024-01-25 DIAGNOSIS — Z124 Encounter for screening for malignant neoplasm of cervix: Secondary | ICD-10-CM | POA: Insufficient documentation

## 2024-01-25 NOTE — Patient Instructions (Signed)
 For patients under 50-65yo, I recommend 1200mg  calcium  daily and 600IU of vitamin D daily. For patients over 65yo, I recommend 1200mg  calcium  daily and 800IU of vitamin D daily.  Health Maintenance, Female Adopting a healthy lifestyle and getting preventive care are important in promoting health and wellness. Ask your health care provider about: The right schedule for you to have regular tests and exams. Things you can do on your own to prevent diseases and keep yourself healthy. What should I know about diet, weight, and exercise? Eat a healthy diet  Eat a diet that includes plenty of vegetables, fruits, low-fat dairy products, and lean protein. Do not eat a lot of foods that are high in solid fats, added sugars, or sodium. Maintain a healthy weight Body mass index (BMI) is used to identify weight problems. It estimates body fat based on height and weight. Your health care provider can help determine your BMI and help you achieve or maintain a healthy weight. Get regular exercise Get regular exercise. This is one of the most important things you can do for your health. Most adults should: Exercise for at least 150 minutes each week. The exercise should increase your heart rate and make you sweat (moderate-intensity exercise). Do strengthening exercises at least twice a week. This is in addition to the moderate-intensity exercise. Spend less time sitting. Even light physical activity can be beneficial. Watch cholesterol and blood lipids Have your blood tested for lipids and cholesterol at 65 years of age, then have this test every 5 years. Have your cholesterol levels checked more often if: Your lipid or cholesterol levels are high. You are older than 65 years of age. You are at high risk for heart disease. What should I know about cancer screening? Depending on your health history and family history, you may need to have cancer screening at various ages. This may include screening  for: Breast cancer. Cervical cancer. Colorectal cancer. Skin cancer. Lung cancer. What should I know about heart disease, diabetes, and high blood pressure? Blood pressure and heart disease High blood pressure causes heart disease and increases the risk of stroke. This is more likely to develop in people who have high blood pressure readings or are overweight. Have your blood pressure checked: Every 3-5 years if you are 25-57 years of age. Every year if you are 24 years old or older. Diabetes Have regular diabetes screenings. This checks your fasting blood sugar level. Have the screening done: Once every three years after age 62 if you are at a normal weight and have a low risk for diabetes. More often and at a younger age if you are overweight or have a high risk for diabetes. What should I know about preventing infection? Hepatitis B If you have a higher risk for hepatitis B, you should be screened for this virus. Talk with your health care provider to find out if you are at risk for hepatitis B infection. Hepatitis C Testing is recommended for: Everyone born from [birth year] through 1967. Anyone with known risk factors for hepatitis C. Sexually transmitted infections (STIs) Get screened for STIs, including gonorrhea and chlamydia, if: You are sexually active and are younger than 65 years of age. You are older than 65 years of age and your health care provider tells you that you are at risk for this type of infection. Your sexual activity has changed since you were last screened, and you are at increased risk for chlamydia or gonorrhea. Ask your health care provider if  you are at risk. Ask your health care provider about whether you are at high risk for HIV. Your health care provider may recommend a prescription medicine to help prevent HIV infection. If you choose to take medicine to prevent HIV, you should first get tested for HIV. You should then be tested every 3 months for as long as you  are taking the medicine. Osteoporosis and menopause Osteoporosis is a disease in which the bones lose minerals and strength with aging. This can result in bone fractures. If you are 72 years old or older, or if you are at risk for osteoporosis and fractures, ask your health care provider if you should: Be screened for bone loss. Take a calcium  or vitamin D supplement to lower your risk of fractures. Be given hormone replacement therapy (HRT) to treat symptoms of menopause. Follow these instructions at home: Alcohol use Do not drink alcohol if: Your health care provider tells you not to drink. You are pregnant, may be pregnant, or are planning to become pregnant. If you drink alcohol: Limit how much you have to: 0-1 drink a day. Know how much alcohol is in your drink. In the [place] one drink equals one 12 oz bottle of beer (355 mL), one 5 oz glass of wine (148 mL), or one 1 oz glass of hard liquor (44 mL). Lifestyle Do not use any products that contain nicotine or tobacco. These products include cigarettes, chewing tobacco, and vaping devices, such as e-cigarettes. If you need help quitting, ask your health care provider. Do not use street drugs. Do not share needles. Ask your health care provider for help if you need support or information about quitting drugs. General instructions Schedule regular health, dental, and eye exams. Stay current with your vaccines. Tell your health care provider if: You often feel depressed. You have ever been abused or do not feel safe at home. Summary Adopting a healthy lifestyle and getting preventive care are important in promoting health and wellness. Follow your health care provider's instructions about healthy diet, exercising, and getting tested or screened for diseases. Follow your health care provider's instructions on monitoring your cholesterol and blood pressure. This information is not intended to replace advice given to you by your health  care provider. Make sure you discuss any questions you have with your health care provider. Document Revised: 10/15/2020 Document Reviewed: 10/15/2020 Elsevier Patient Education  2024 [employer].

## 2024-01-25 NOTE — Assessment & Plan Note (Signed)
 Cervical cancer screening performed according to ASCCP guidelines. Encouraged annual mammogram screening Colonoscopy UTd DXA UTD Labs and immunizations with her primary Encouraged safe sexual practices as indicated Encouraged healthy lifestyle practices with diet and exercise For patients under 50-65yo, I recommend 1200mg  calcium  daily and 600IU of vitamin D daily.

## 2024-01-29 DIAGNOSIS — Z6836 Body mass index (BMI) 36.0-36.9, adult: Secondary | ICD-10-CM | POA: Diagnosis not present

## 2024-01-29 DIAGNOSIS — K219 Gastro-esophageal reflux disease without esophagitis: Secondary | ICD-10-CM | POA: Diagnosis not present

## 2024-01-29 DIAGNOSIS — E039 Hypothyroidism, unspecified: Secondary | ICD-10-CM | POA: Diagnosis not present

## 2024-01-29 DIAGNOSIS — E66812 Obesity, class 2: Secondary | ICD-10-CM | POA: Diagnosis not present

## 2024-01-29 DIAGNOSIS — E119 Type 2 diabetes mellitus without complications: Secondary | ICD-10-CM | POA: Diagnosis not present

## 2024-01-29 DIAGNOSIS — E7800 Pure hypercholesterolemia, unspecified: Secondary | ICD-10-CM | POA: Diagnosis not present

## 2024-02-02 LAB — CYTOLOGY - PAP
Comment: NEGATIVE
Diagnosis: REACTIVE
High risk HPV: NEGATIVE

## 2024-02-04 ENCOUNTER — Ambulatory Visit: Payer: Self-pay | Admitting: Obstetrics and Gynecology

## 2024-03-01 ENCOUNTER — Other Ambulatory Visit: Payer: Self-pay | Admitting: Family Medicine

## 2024-03-01 DIAGNOSIS — Z1231 Encounter for screening mammogram for malignant neoplasm of breast: Secondary | ICD-10-CM

## 2024-03-23 DIAGNOSIS — Z23 Encounter for immunization: Secondary | ICD-10-CM | POA: Diagnosis not present

## 2024-04-04 ENCOUNTER — Ambulatory Visit
Admission: RE | Admit: 2024-04-04 | Discharge: 2024-04-04 | Disposition: A | Source: Ambulatory Visit | Attending: Family Medicine | Admitting: Family Medicine

## 2024-04-04 DIAGNOSIS — Z1231 Encounter for screening mammogram for malignant neoplasm of breast: Secondary | ICD-10-CM

## 2024-04-21 ENCOUNTER — Ambulatory Visit: Attending: Physician Assistant | Admitting: Physician Assistant

## 2024-04-21 ENCOUNTER — Encounter: Payer: Self-pay | Admitting: Physician Assistant

## 2024-04-21 VITALS — BP 124/79 | HR 74 | Resp 16 | Ht 59.0 in | Wt 171.8 lb

## 2024-04-21 DIAGNOSIS — R072 Precordial pain: Secondary | ICD-10-CM | POA: Diagnosis not present

## 2024-04-21 DIAGNOSIS — E119 Type 2 diabetes mellitus without complications: Secondary | ICD-10-CM

## 2024-04-21 DIAGNOSIS — I10 Essential (primary) hypertension: Secondary | ICD-10-CM

## 2024-04-21 DIAGNOSIS — E785 Hyperlipidemia, unspecified: Secondary | ICD-10-CM | POA: Diagnosis not present

## 2024-04-21 LAB — BASIC METABOLIC PANEL WITH GFR
BUN/Creatinine Ratio: 20 (ref 12–28)
BUN: 13 mg/dL (ref 8–27)
CO2: 23 mmol/L (ref 20–29)
Calcium: 9.5 mg/dL (ref 8.7–10.3)
Chloride: 103 mmol/L (ref 96–106)
Creatinine, Ser: 0.65 mg/dL (ref 0.57–1.00)
Glucose: 82 mg/dL (ref 70–99)
Potassium: 4.2 mmol/L (ref 3.5–5.2)
Sodium: 138 mmol/L (ref 134–144)
eGFR: 98 mL/min/1.73 (ref >59)

## 2024-04-21 MED ORDER — METOPROLOL TARTRATE 50 MG PO TABS: 50.0000 mg | ORAL_TABLET | Freq: Once | ORAL | 0 refills | Status: AC

## 2024-04-21 NOTE — Patient Instructions (Addendum)
 Medication Instructions:  No Changes  *Please see specific medication instructions to prepare for your Cardiac CT below  Lab Work: Today: BMET (to check kidney function and electrolytes)  Testing/Procedures:  Your cardiac CT will be scheduled at the below location:   [facility] [address]  [place], [place] [address]   If scheduled at the [facility] at [address], please enter the parking lot using the [address] entrance and use the FREE valet service at the patient drop-off area. Enter the building and check-in with registration on the main floor.   Please follow these instructions carefully (unless otherwise directed):  An IV will be required for this test and Nitroglycerin will be given.   On the Night Before the Test: Be sure to Drink plenty of water. Do not consume any caffeinated/decaffeinated beverages or chocolate 12 hours prior to your test. Do not take any antihistamines 12 hours prior to your test. Hold your Metformin 24 hours before test and 48 hours after test  On the Day of the Test: Drink plenty of water until 1 hour prior to the test. Do not eat any food 1 hour prior to test. Hold your Metformin 24 hours before the test and 48 hours after test  Take metoprolol (Lopressor) 50 mg two hours prior to test. If you take Furosemide/Hydrochlorothiazide/Spironolactone/Chlorthalidone, please HOLD on the morning of the test. Patients who wear a continuous glucose monitor MUST remove the device prior to scanning. FEMALES- please wear underwire-free bra if available, avoid dresses & tight clothing    After the Test: Drink plenty of water. After receiving IV contrast, you may experience a mild flushed feeling. This is normal. On occasion, you may experience a mild rash up to 24 hours after the test. This is not dangerous. If this occurs, you can take Benadryl  25 mg, Zyrtec , Claritin, or Allegra and increase your  fluid intake. (Patients taking Tikosyn should avoid Benadryl , and may take Zyrtec , Claritin, or Allegra) If you experience trouble breathing, this can be serious. If it is severe call 911 IMMEDIATELY. If it is mild, please call our office.  We will call to schedule your test 2-4 weeks out understanding that some insurance companies will need an authorization prior to the service being performed.   For more information and frequently asked questions, please visit our website : [email or web address]  For non-scheduling related questions, please contact the cardiac imaging nurse navigator should you have any questions/concerns: Cardiac Imaging Nurse Navigators Direct Office Dial: [phone]   For scheduling needs, including cancellations and rescheduling, please call Brittany, [phone].   Follow-Up: At [facility], you and your health needs are our priority.  As part of our continuing mission to provide you with exceptional heart care, our providers are all part of one team.  This team includes your primary Cardiologist (physician) and Advanced Practice Providers or APPs (Physician Assistants and Nurse Practitioners) who all work together to provide you with the care you need, when you need it.  Your next appointment:    Depending on results; if all is within normal limits, you can see us  AS NEEDED  Provider:   Dr. Darryle Decent or Scot Ford, [place]

## 2024-04-21 NOTE — Progress Notes (Signed)
 Cardiology Office Note   Date:  04/23/2024  ID:  Wendy Curtis, DOB 01-06-1961, MRN [id number] PCP: Regino Slater, MD  [facility] Providers Cardiologist:  Darryle ONEIDA Decent, MD     History of Present Illness Wendy Curtis is a 63 y.o. female with past medical history of hypertension, hyperlipidemia, DM2, large sliding hiatal hernia and obesity.  She has significant family history of heart disease.  Calcium  scoring test obtained in March 2022 showed a coronary calcium  score of 0.  He was last seen by Dr. Decent in February 2023 at which time she was walking 6000 steps per day.  She was instructed to follow-up as needed.  Patient was seen in the ED on 06/09/2022.  Serial troponin x 2, CBC, basic metabolic panel were reassuring.  Patient presents today for follow-up.  She complained of pain under her left breast that has been going back several years.  She also noted recent right arm pain as well.  The symptom does not have clear correlation with exertion.  She suspected left chest discomfort is related to the large sliding hiatal hernia and  called her GI doctor's office and was told to go to the emergency room. I will obtain a coronary CTA to rule out underlying coronary artery disease.  She will need a single dose of 50 mg metoprolol 2 hours in the morning of the coronary CTA.  She will also need a basic metabolic panel.  She has been instructed to hold metformin for 24 hours before and 48 hours after the contrast use.  ROS:   Patient complains of pain under her left breast that has been intermittent for the past several years.  She also has right arm pain.  She denies any shortness of breath.  Studies Reviewed EKG Interpretation Date/Time:  Thursday April 21 2024 08:39:19 EST Ventricular Rate:  76 PR Interval:  162 QRS Duration:  76 QT Interval:  402 QTC Calculation: 452 R Axis:   -2  Text Interpretation: Normal sinus rhythm with sinus arrhythmia Low voltage QRS Inferior  infarct (cited on or before 09-Jun-2022) Cannot rule out Anterior infarct (cited on or before 09-Jun-2022) When compared with ECG of 09-Jun-2022 16:52, No significant change was found Confirmed by Janene Boer [phone]) on 04/21/2024 8:58:55 AM     Risk Assessment/Calculations         Physical Exam VS:  BP 124/79 (BP Location: Left Arm, Patient Position: Sitting, Cuff Size: Normal)   Pulse 74   Resp 16   Ht 4' 11 (1.499 m)   Wt 171 lb 12.8 oz (77.9 kg)   LMP 06/09/2012   SpO2 99%   BMI 34.70 kg/m        Wt Readings from Last 3 Encounters:  04/21/24 171 lb 12.8 oz (77.9 kg)  01/25/24 172 lb (78 kg)  12/03/22 175 lb (79.4 kg)    GEN: Well nourished, well developed in no acute distress NECK: No JVD; No carotid bruits CARDIAC: RRR, no murmurs, rubs, gallops RESPIRATORY:  Clear to auscultation without rales, wheezing or rhonchi  ABDOMEN: Soft, non-tender, non-distended EXTREMITIES:  No edema; No deformity   ASSESSMENT AND PLAN  Precordial chest pain: I recommended proceed with coronary CTA.  She will need 50 mg dose of metoprolol in the morning of coronary CT.  Patient also has a history of large sliding hiatal hernia.  If coronary CTA is negative, she will need GI workup.  Hypertension: Blood pressure stable  Hyperlipidemia: On rosuvastatin   DM2: Managed by  primary care provider.        Dispo: If the coronary CTA is negative, patient can follow-up as needed.  Signed, Scot Ford, PA

## 2024-04-25 ENCOUNTER — Ambulatory Visit: Payer: Self-pay | Admitting: Physician Assistant

## 2024-04-25 NOTE — Progress Notes (Signed)
Stable renal function and electrolyte

## 2024-04-28 ENCOUNTER — Encounter (HOSPITAL_COMMUNITY): Payer: Self-pay

## 2024-05-03 ENCOUNTER — Ambulatory Visit (HOSPITAL_COMMUNITY): Admission: RE | Admit: 2024-05-03 | Source: Ambulatory Visit

## 2024-05-17 ENCOUNTER — Telehealth (HOSPITAL_COMMUNITY): Payer: Self-pay | Admitting: *Deleted

## 2024-05-17 NOTE — Telephone Encounter (Signed)
Patient calling about her upcoming cardiac imaging study; pt verbalizes understanding of appt date/time, parking situation and where to check in, pre-test NPO status and medications ordered, and verified current allergies; name and call back number provided for further questions should they arise  Gordy Clement RN Navigator Cardiac Imaging [facility] [phone] office [phone] cell  Patient to take 50mg  metoprolol tartrate two hours prior to her cardiac CT scan.

## 2024-05-18 ENCOUNTER — Ambulatory Visit (HOSPITAL_COMMUNITY)
Admission: RE | Admit: 2024-05-18 | Discharge: 2024-05-18 | Disposition: A | Source: Ambulatory Visit | Attending: Cardiovascular Disease | Admitting: Cardiovascular Disease

## 2024-05-18 DIAGNOSIS — R072 Precordial pain: Secondary | ICD-10-CM | POA: Diagnosis not present

## 2024-05-18 MED ORDER — NITROGLYCERIN 0.4 MG SL SUBL
0.8000 mg | SUBLINGUAL_TABLET | Freq: Once | SUBLINGUAL | Status: AC
  Administered 2024-05-18: 0.8 mg via SUBLINGUAL

## 2024-05-18 MED ORDER — IOHEXOL 350 MG/ML SOLN
100.0000 mL | Freq: Once | INTRAVENOUS | Status: AC | PRN
  Administered 2024-05-18: 100 mL via INTRAVENOUS

## 2024-05-30 NOTE — Progress Notes (Signed)
 Noncardiac portion read by radiologist, it showed a large hiatal hernia, but no acute finding.
